# Patient Record
Sex: Female | Born: 1970 | Race: White | Hispanic: No | Marital: Married | Smoking: Never smoker
Health system: Southern US, Community
[De-identification: ages and names within clinical notes are randomized; demographics above are authoritative.]

## PROBLEM LIST (undated history)

## (undated) DIAGNOSIS — M7712 Lateral epicondylitis, left elbow: Secondary | ICD-10-CM

## (undated) DIAGNOSIS — J309 Allergic rhinitis, unspecified: Secondary | ICD-10-CM

## (undated) DIAGNOSIS — M19022 Primary osteoarthritis, left elbow: Secondary | ICD-10-CM

## (undated) DIAGNOSIS — N200 Calculus of kidney: Secondary | ICD-10-CM

## (undated) DIAGNOSIS — S83241A Other tear of medial meniscus, current injury, right knee, initial encounter: Secondary | ICD-10-CM

## (undated) DIAGNOSIS — K644 Residual hemorrhoidal skin tags: Secondary | ICD-10-CM

## (undated) DIAGNOSIS — J45909 Unspecified asthma, uncomplicated: Secondary | ICD-10-CM

## (undated) DIAGNOSIS — I10 Essential (primary) hypertension: Secondary | ICD-10-CM

## (undated) DIAGNOSIS — R600 Localized edema: Secondary | ICD-10-CM

## (undated) DIAGNOSIS — N3281 Overactive bladder: Secondary | ICD-10-CM

## (undated) DIAGNOSIS — N3010 Interstitial cystitis (chronic) without hematuria: Secondary | ICD-10-CM

## (undated) HISTORY — DX: Calculus of kidney: N20.0

## (undated) HISTORY — DX: Localized edema: R60.0

## (undated) HISTORY — PX: WISDOM TOOTH EXTRACTION: SHX21

## (undated) HISTORY — DX: Lateral epicondylitis, left elbow: M77.12

## (undated) HISTORY — DX: Allergic rhinitis, unspecified: J30.9

## (undated) HISTORY — DX: Interstitial cystitis (chronic) without hematuria: N30.10

## (undated) HISTORY — DX: Primary osteoarthritis, left elbow: M19.022

## (undated) HISTORY — DX: Essential (primary) hypertension: I10

## (undated) HISTORY — DX: Residual hemorrhoidal skin tags: K64.4

## (undated) HISTORY — DX: Overactive bladder: N32.81

---

## 1998-09-22 ENCOUNTER — Other Ambulatory Visit: Admission: RE | Admit: 1998-09-22 | Discharge: 1998-09-22 | Payer: Self-pay | Admitting: *Deleted

## 1999-05-13 ENCOUNTER — Ambulatory Visit (HOSPITAL_COMMUNITY): Admission: RE | Admit: 1999-05-13 | Discharge: 1999-05-13 | Payer: Self-pay | Admitting: Family Medicine

## 1999-05-13 ENCOUNTER — Encounter: Payer: Self-pay | Admitting: Family Medicine

## 1999-11-06 ENCOUNTER — Other Ambulatory Visit: Admission: RE | Admit: 1999-11-06 | Discharge: 1999-11-06 | Payer: Self-pay | Admitting: *Deleted

## 2000-05-26 ENCOUNTER — Encounter: Payer: Self-pay | Admitting: Orthopedic Surgery

## 2000-05-26 ENCOUNTER — Ambulatory Visit (HOSPITAL_COMMUNITY): Admission: RE | Admit: 2000-05-26 | Discharge: 2000-05-26 | Payer: Self-pay | Admitting: Orthopedic Surgery

## 2000-12-02 ENCOUNTER — Other Ambulatory Visit: Admission: RE | Admit: 2000-12-02 | Discharge: 2000-12-02 | Payer: Self-pay | Admitting: *Deleted

## 2002-03-05 ENCOUNTER — Other Ambulatory Visit: Admission: RE | Admit: 2002-03-05 | Discharge: 2002-03-05 | Payer: Self-pay | Admitting: Obstetrics and Gynecology

## 2002-03-07 ENCOUNTER — Encounter: Payer: Self-pay | Admitting: Obstetrics and Gynecology

## 2002-03-07 ENCOUNTER — Encounter: Admission: RE | Admit: 2002-03-07 | Discharge: 2002-03-07 | Payer: Self-pay | Admitting: Obstetrics and Gynecology

## 2002-05-02 ENCOUNTER — Encounter: Payer: Self-pay | Admitting: Obstetrics and Gynecology

## 2002-05-02 ENCOUNTER — Encounter: Admission: RE | Admit: 2002-05-02 | Discharge: 2002-05-02 | Payer: Self-pay | Admitting: Obstetrics and Gynecology

## 2006-06-28 ENCOUNTER — Encounter: Admission: RE | Admit: 2006-06-28 | Discharge: 2006-06-28 | Payer: Self-pay | Admitting: Internal Medicine

## 2007-07-05 ENCOUNTER — Encounter: Admission: RE | Admit: 2007-07-05 | Discharge: 2007-07-05 | Payer: Self-pay | Admitting: Internal Medicine

## 2008-07-05 ENCOUNTER — Encounter: Admission: RE | Admit: 2008-07-05 | Discharge: 2008-07-05 | Payer: Self-pay | Admitting: Internal Medicine

## 2008-07-09 ENCOUNTER — Encounter: Admission: RE | Admit: 2008-07-09 | Discharge: 2008-07-09 | Payer: Self-pay | Admitting: Internal Medicine

## 2010-10-19 ENCOUNTER — Other Ambulatory Visit: Payer: Self-pay | Admitting: Internal Medicine

## 2010-10-19 DIAGNOSIS — Z1231 Encounter for screening mammogram for malignant neoplasm of breast: Secondary | ICD-10-CM

## 2010-10-27 ENCOUNTER — Other Ambulatory Visit: Payer: Self-pay | Admitting: Internal Medicine

## 2010-10-27 ENCOUNTER — Ambulatory Visit
Admission: RE | Admit: 2010-10-27 | Discharge: 2010-10-27 | Disposition: A | Discharge status: Home / Self Care | Payer: BC Managed Care – PPO | Source: Ambulatory Visit | Attending: Internal Medicine | Admitting: Internal Medicine

## 2010-10-27 DIAGNOSIS — N63 Unspecified lump in unspecified breast: Secondary | ICD-10-CM

## 2010-10-27 DIAGNOSIS — Z1231 Encounter for screening mammogram for malignant neoplasm of breast: Secondary | ICD-10-CM

## 2010-11-02 ENCOUNTER — Ambulatory Visit
Admission: RE | Admit: 2010-11-02 | Discharge: 2010-11-02 | Disposition: A | Discharge status: Home / Self Care | Payer: BC Managed Care – PPO | Source: Ambulatory Visit | Attending: Internal Medicine | Admitting: Internal Medicine

## 2010-11-02 DIAGNOSIS — N63 Unspecified lump in unspecified breast: Secondary | ICD-10-CM

## 2011-11-19 ENCOUNTER — Other Ambulatory Visit: Payer: Self-pay | Admitting: Internal Medicine

## 2011-11-19 DIAGNOSIS — Z1231 Encounter for screening mammogram for malignant neoplasm of breast: Secondary | ICD-10-CM

## 2012-01-13 ENCOUNTER — Ambulatory Visit
Admission: RE | Admit: 2012-01-13 | Discharge: 2012-01-13 | Disposition: A | Discharge status: Home / Self Care | Payer: BC Managed Care – PPO | Source: Ambulatory Visit | Attending: Internal Medicine | Admitting: Internal Medicine

## 2012-01-13 DIAGNOSIS — Z1231 Encounter for screening mammogram for malignant neoplasm of breast: Secondary | ICD-10-CM

## 2012-12-28 ENCOUNTER — Other Ambulatory Visit: Payer: Self-pay

## 2012-12-28 DIAGNOSIS — Z1231 Encounter for screening mammogram for malignant neoplasm of breast: Secondary | ICD-10-CM

## 2013-01-04 ENCOUNTER — Encounter: Payer: Self-pay | Admitting: Internal Medicine

## 2013-01-25 ENCOUNTER — Encounter: Payer: Self-pay | Admitting: *Deleted

## 2013-01-29 ENCOUNTER — Ambulatory Visit
Admission: RE | Admit: 2013-01-29 | Discharge: 2013-01-29 | Disposition: A | Discharge status: Home / Self Care | Payer: BC Managed Care – PPO | Source: Ambulatory Visit

## 2013-01-29 DIAGNOSIS — Z1231 Encounter for screening mammogram for malignant neoplasm of breast: Secondary | ICD-10-CM

## 2013-03-23 ENCOUNTER — Ambulatory Visit: Payer: BC Managed Care – PPO | Admitting: Internal Medicine

## 2013-05-15 ENCOUNTER — Ambulatory Visit: Payer: BC Managed Care – PPO | Admitting: Internal Medicine

## 2013-05-23 ENCOUNTER — Inpatient Hospital Stay (HOSPITAL_COMMUNITY)
Admission: EM | Admit: 2013-05-23 | Discharge: 2013-05-25 | DRG: 202 | Disposition: A | Discharge status: Home / Self Care | Payer: BC Managed Care – PPO | Attending: Internal Medicine | Admitting: Internal Medicine

## 2013-05-23 ENCOUNTER — Encounter (HOSPITAL_COMMUNITY): Payer: Self-pay | Admitting: Emergency Medicine

## 2013-05-23 ENCOUNTER — Emergency Department (HOSPITAL_COMMUNITY): Payer: BC Managed Care – PPO

## 2013-05-23 DIAGNOSIS — E860 Dehydration: Secondary | ICD-10-CM | POA: Diagnosis present

## 2013-05-23 DIAGNOSIS — I498 Other specified cardiac arrhythmias: Secondary | ICD-10-CM | POA: Diagnosis present

## 2013-05-23 DIAGNOSIS — I10 Essential (primary) hypertension: Secondary | ICD-10-CM | POA: Diagnosis present

## 2013-05-23 DIAGNOSIS — Z800 Family history of malignant neoplasm of digestive organs: Secondary | ICD-10-CM

## 2013-05-23 DIAGNOSIS — J45901 Unspecified asthma with (acute) exacerbation: Secondary | ICD-10-CM | POA: Diagnosis present

## 2013-05-23 DIAGNOSIS — Z823 Family history of stroke: Secondary | ICD-10-CM

## 2013-05-23 DIAGNOSIS — E872 Acidosis, unspecified: Secondary | ICD-10-CM | POA: Diagnosis present

## 2013-05-23 DIAGNOSIS — R000 Tachycardia, unspecified: Secondary | ICD-10-CM

## 2013-05-23 DIAGNOSIS — Z833 Family history of diabetes mellitus: Secondary | ICD-10-CM

## 2013-05-23 DIAGNOSIS — J4521 Mild intermittent asthma with (acute) exacerbation: Secondary | ICD-10-CM

## 2013-05-23 DIAGNOSIS — J40 Bronchitis, not specified as acute or chronic: Secondary | ICD-10-CM

## 2013-05-23 DIAGNOSIS — N3010 Interstitial cystitis (chronic) without hematuria: Secondary | ICD-10-CM | POA: Diagnosis present

## 2013-05-23 DIAGNOSIS — R509 Fever, unspecified: Secondary | ICD-10-CM | POA: Diagnosis present

## 2013-05-23 DIAGNOSIS — J209 Acute bronchitis, unspecified: Principal | ICD-10-CM | POA: Diagnosis present

## 2013-05-23 DIAGNOSIS — Z8249 Family history of ischemic heart disease and other diseases of the circulatory system: Secondary | ICD-10-CM

## 2013-05-23 DIAGNOSIS — J111 Influenza due to unidentified influenza virus with other respiratory manifestations: Secondary | ICD-10-CM | POA: Diagnosis present

## 2013-05-23 DIAGNOSIS — R300 Dysuria: Secondary | ICD-10-CM | POA: Diagnosis present

## 2013-05-23 HISTORY — DX: Unspecified asthma, uncomplicated: J45.909

## 2013-05-23 LAB — CBC WITH DIFFERENTIAL/PLATELET
BASOS PCT: 0 % (ref 0–1)
Basophils Absolute: 0 10*3/uL (ref 0.0–0.1)
Eosinophils Absolute: 0 10*3/uL (ref 0.0–0.7)
Eosinophils Relative: 0 % (ref 0–5)
HCT: 39.7 % (ref 36.0–46.0)
HEMOGLOBIN: 13.5 g/dL (ref 12.0–15.0)
LYMPHS ABS: 0.6 10*3/uL — AB (ref 0.7–4.0)
LYMPHS PCT: 11 % — AB (ref 12–46)
MCH: 31.5 pg (ref 26.0–34.0)
MCHC: 34 g/dL (ref 30.0–36.0)
MCV: 92.5 fL (ref 78.0–100.0)
MONOS PCT: 9 % (ref 3–12)
Monocytes Absolute: 0.5 10*3/uL (ref 0.1–1.0)
NEUTROS ABS: 4.1 10*3/uL (ref 1.7–7.7)
NEUTROS PCT: 79 % — AB (ref 43–77)
Platelets: 142 10*3/uL — ABNORMAL LOW (ref 150–400)
RBC: 4.29 MIL/uL (ref 3.87–5.11)
RDW: 12.3 % (ref 11.5–15.5)
WBC: 5.2 10*3/uL (ref 4.0–10.5)

## 2013-05-23 LAB — INFLUENZA PANEL BY PCR (TYPE A & B)
H1N1 flu by pcr: NOT DETECTED
INFLAPCR: NEGATIVE
INFLBPCR: POSITIVE — AB

## 2013-05-23 LAB — COMPREHENSIVE METABOLIC PANEL
ALBUMIN: 3.8 g/dL (ref 3.5–5.2)
ALK PHOS: 50 U/L (ref 39–117)
ALT: 19 U/L (ref 0–35)
AST: 29 U/L (ref 0–37)
BUN: 11 mg/dL (ref 6–23)
CHLORIDE: 99 meq/L (ref 96–112)
CO2: 18 mEq/L — ABNORMAL LOW (ref 19–32)
Calcium: 9 mg/dL (ref 8.4–10.5)
Creatinine, Ser: 0.82 mg/dL (ref 0.50–1.10)
GFR calc Af Amer: >90 mL/min (ref >90)
GFR calc non Af Amer: 87 mL/min — ABNORMAL LOW (ref >90)
GLUCOSE: 118 mg/dL — AB (ref 70–99)
POTASSIUM: 3.5 meq/L — AB (ref 3.7–5.3)
Sodium: 137 mEq/L (ref 137–147)
Total Protein: 7 g/dL (ref 6.0–8.3)

## 2013-05-23 LAB — URINALYSIS, ROUTINE W REFLEX MICROSCOPIC
BILIRUBIN URINE: NEGATIVE
GLUCOSE, UA: NEGATIVE mg/dL
Hgb urine dipstick: NEGATIVE
KETONES UR: NEGATIVE mg/dL
Nitrite: NEGATIVE
Protein, ur: NEGATIVE mg/dL
Specific Gravity, Urine: 1.023 (ref 1.005–1.030)
Urobilinogen, UA: 0.2 mg/dL (ref 0.0–1.0)
pH: 6.5 (ref 5.0–8.0)

## 2013-05-23 LAB — URINE MICROSCOPIC-ADD ON

## 2013-05-23 LAB — LACTIC ACID, PLASMA: LACTIC ACID, VENOUS: 3.9 mmol/L — AB (ref 0.5–2.2)

## 2013-05-23 MED ORDER — METHYLPREDNISOLONE SODIUM SUCC 125 MG IJ SOLR
125.0000 mg | Freq: Once | INTRAMUSCULAR | Status: AC
  Administered 2013-05-23: 125 mg via INTRAVENOUS
  Filled 2013-05-23: qty 2

## 2013-05-23 MED ORDER — SODIUM CHLORIDE 0.9 % IJ SOLN: 3.0000 mL | Freq: Two times a day (BID) | INTRAMUSCULAR | Status: DC

## 2013-05-23 MED ORDER — ONDANSETRON HCL 4 MG PO TABS: 4.0000 mg | ORAL_TABLET | Freq: Four times a day (QID) | ORAL | Status: DC | PRN

## 2013-05-23 MED ORDER — LEVOFLOXACIN IN D5W 500 MG/100ML IV SOLN
500.0000 mg | INTRAVENOUS | Status: DC
  Administered 2013-05-23: 500 mg via INTRAVENOUS
  Filled 2013-05-23: qty 100

## 2013-05-23 MED ORDER — LEVALBUTEROL HCL 0.63 MG/3ML IN NEBU
0.6300 mg | INHALATION_SOLUTION | RESPIRATORY_TRACT | Status: DC
  Administered 2013-05-23 – 2013-05-24 (×4): 0.63 mg via RESPIRATORY_TRACT
  Filled 2013-05-23 (×10): qty 3

## 2013-05-23 MED ORDER — LEVALBUTEROL HCL 0.63 MG/3ML IN NEBU: 0.6300 mg | INHALATION_SOLUTION | RESPIRATORY_TRACT | Status: DC | PRN

## 2013-05-23 MED ORDER — ALBUTEROL (5 MG/ML) CONTINUOUS INHALATION SOLN
15.0000 mg/h | INHALATION_SOLUTION | Freq: Once | RESPIRATORY_TRACT | Status: AC
  Administered 2013-05-23: 15 mg/h via RESPIRATORY_TRACT
  Filled 2013-05-23: qty 20

## 2013-05-23 MED ORDER — SODIUM CHLORIDE 0.9 % IV SOLN
INTRAVENOUS | Status: DC
  Administered 2013-05-23: 14:00:00 via INTRAVENOUS

## 2013-05-23 MED ORDER — BENZONATATE 100 MG PO CAPS
100.0000 mg | ORAL_CAPSULE | Freq: Three times a day (TID) | ORAL | Status: DC
  Administered 2013-05-23 – 2013-05-25 (×6): 100 mg via ORAL
  Filled 2013-05-23 (×8): qty 1

## 2013-05-23 MED ORDER — GUAIFENESIN-DM 100-10 MG/5ML PO SYRP
5.0000 mL | ORAL_SOLUTION | ORAL | Status: DC | PRN
  Administered 2013-05-23 – 2013-05-24 (×7): 5 mL via ORAL
  Filled 2013-05-23 (×7): qty 10

## 2013-05-23 MED ORDER — IPRATROPIUM BROMIDE 0.02 % IN SOLN
0.5000 mg | Freq: Once | RESPIRATORY_TRACT | Status: AC
  Administered 2013-05-23: 0.5 mg via RESPIRATORY_TRACT
  Filled 2013-05-23: qty 2.5

## 2013-05-23 MED ORDER — METHYLPREDNISOLONE SODIUM SUCC 125 MG IJ SOLR
60.0000 mg | Freq: Four times a day (QID) | INTRAMUSCULAR | Status: DC
  Administered 2013-05-23 – 2013-05-24 (×3): 60 mg via INTRAVENOUS
  Filled 2013-05-23 (×7): qty 0.96

## 2013-05-23 MED ORDER — MORPHINE SULFATE 2 MG/ML IJ SOLN: 1.0000 mg | INTRAMUSCULAR | Status: DC | PRN

## 2013-05-23 MED ORDER — ENOXAPARIN SODIUM 40 MG/0.4ML ~~LOC~~ SOLN
40.0000 mg | SUBCUTANEOUS | Status: DC
  Administered 2013-05-23 – 2013-05-24 (×2): 40 mg via SUBCUTANEOUS
  Filled 2013-05-23 (×3): qty 0.4

## 2013-05-23 MED ORDER — SODIUM CHLORIDE 0.9 % IV SOLN
INTRAVENOUS | Status: DC
  Administered 2013-05-23 – 2013-05-24 (×3): via INTRAVENOUS

## 2013-05-23 MED ORDER — OXYCODONE HCL 5 MG PO TABS: 5.0000 mg | ORAL_TABLET | ORAL | Status: DC | PRN

## 2013-05-23 MED ORDER — ACETAMINOPHEN 650 MG RE SUPP: 650.0000 mg | Freq: Four times a day (QID) | RECTAL | Status: DC | PRN

## 2013-05-23 MED ORDER — ACETAMINOPHEN 325 MG PO TABS
650.0000 mg | ORAL_TABLET | Freq: Four times a day (QID) | ORAL | Status: DC | PRN
  Administered 2013-05-23 – 2013-05-24 (×3): 650 mg via ORAL
  Filled 2013-05-23 (×3): qty 2

## 2013-05-23 MED ORDER — ONDANSETRON HCL 4 MG/2ML IJ SOLN: 4.0000 mg | Freq: Four times a day (QID) | INTRAMUSCULAR | Status: DC | PRN

## 2013-05-23 NOTE — ED Notes (Signed)
Patient transported to X-ray 

## 2013-05-23 NOTE — H&P (Signed)
[facility] History and Physical  Laura Pope HQI:[id number] DOB: 10/11/1970 DOA: 05/23/2013   PCP: Nicholos Johns, MD  Specialists: She is followed by a pulmonologist, Dr. Carmelina Peal?  Chief Complaint: Cough and wheezing since Tuesday  HPI: Laura Pope is a 43 y.o. female with a past medical history of asthma, hypertension, who was in her usual state of health till this Monday, when she started noticing some itching in the lower part of her face. She tells me, that's usually a sign that she is going to get an asthma exacerbation. Subsequently on Tuesday, she started having a cough. Started having wheezing. She was working in a preschool. She tried taking her nebulizer treatments at home without any relief. She started having back pain and rib pain due to cough paroxysms. The cough has been dry. Denies any blood in the sputum. She had one episode when she had brown expectoration earlier today. Denies any nausea, vomiting. No fever or chills at home, but she was found to be with low-grade fever in the emergency department. She's had some tingling in her toes and fingers earlier today. She said that many children in her school had strep. She denies any sore throat, however. And her husband was diagnosed with pneumonia and influenza last week. She did get her flu shot this season. Denies any recent travel anywhere. No recent antibiotic use.  Home Medications: Prior to Admission medications   Medication Sig Start Date End Date Taking? Authorizing Provider  acetaminophen (TYLENOL) 500 MG tablet Take 1,000 mg by mouth every 4 (four) hours as needed for headache.   Yes Historical Provider, MD  albuterol (ACCUNEB) 1.25 MG/3ML nebulizer solution Take 2 ampules by nebulization every 6 (six) hours as needed for wheezing.   Yes Historical Provider, MD  albuterol (PROVENTIL HFA;VENTOLIN HFA) 108 (90 BASE) MCG/ACT inhaler Inhale 2 puffs into the lungs every 4 (four) hours as needed for wheezing or  shortness of breath.   Yes Historical Provider, MD  fluticasone (FLONASE) 50 MCG/ACT nasal spray Place 2 sprays into both nostrils daily.   Yes Historical Provider, MD  lisinopril-hydrochlorothiazide (PRINZIDE,ZESTORETIC) 10-12.5 MG per tablet Take 1 tablet by mouth daily.   Yes Historical Provider, MD  montelukast (SINGULAIR) 10 MG tablet Take 10 mg by mouth at bedtime.   Yes Historical Provider, MD  pentosan polysulfate (ELMIRON) 100 MG capsule Take 100 mg by mouth 3 (three) times daily.   Yes Historical Provider, MD  predniSONE (STERAPRED UNI-PAK) 10 MG tablet Take 5 tablets by mouth See admin instructions. Take 6 tablets on day 1, day 2 take 5 tablets, day 3 take 4 tablets, day 4 take 3 tablets, day 5 take 2 tablets. And day 6 take 1 tablet 05/23/13  Yes Historical Provider, MD  triamcinolone acetonide (KENALOG) 40 MG/ML injection Inject 40 mg into the muscle once.   Yes Historical Provider, MD  Vitamin D, Ergocalciferol, (DRISDOL) 50000 UNITS CAPS capsule Take 50,000 Units by mouth every 7 (seven) days. On mondays   Yes Historical Provider, MD    Allergies:  Allergies  Allergen Reactions  . Aspirin Swelling    Throat swelling  . Mushroom Extract Complex Swelling  . Contrast Media [Iodinated Diagnostic Agents] Itching and Rash  . Penicillins Swelling and Rash  . Vicodin [Hydrocodone-Acetaminophen] Swelling and Rash    Past Medical History: Past Medical History  Diagnosis Date  . External hemorrhoids   . Lateral epicondylitis of left elbow   . Arthritis of elbow, left   . Renal  calculi   . Pedal edema   . Overactive bladder   . Interstitial cystitis   . Allergic rhinitis   . Asthma     Past Surgical History  Procedure Laterality Date  . None    . Wisdom tooth extraction    . Left knee  surgery      Social History: She lives about 30 minutes from [place] with her family. 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 No smoking, alcohol or illicit drug use. Usually independent with  daily activities.  Family History:  Family History  Problem Relation Age of Onset  . Colon polyps Mother   . Colon cancer    . Hyperlipidemia    . Asthma    . Thyroid disease    . Diabetes    . Heart disease    . Stroke    . Diabetes Mother   . Hyperlipidemia Mother   . Hypertension Mother   . Heart disease Mother   . Thyroid disease Mother   . Arthritis Mother   . Asthma Sister   . Hyperlipidemia Father   . Heart attack Father      Review of Systems - History obtained from the patient General ROS: positive for  - fatigue Psychological ROS: negative Ophthalmic ROS: negative ENT ROS: negative Allergy and Immunology ROS: negative Hematological and Lymphatic ROS: negative Endocrine ROS: negative Respiratory ROS: as in hpi Cardiovascular ROS: ribcage pain Gastrointestinal ROS: no abdominal pain, change in bowel habits, or black or bloody stools Genito-Urinary ROS: some pain with urination Musculoskeletal ROS: negative Neurological ROS: no TIA or stroke symptoms Dermatological ROS: negative  Physical Examination  Filed Vitals:   05/23/13 1244 05/23/13 1257 05/23/13 1413  BP: 121/75    Pulse: 122 108   Temp: 99.6 F (37.6 C)  100.4 F (38 C)  TempSrc: Oral  Oral  Resp: 24    SpO2: 100%      BP 121/75  Pulse 108  Temp(Src) 100.4 F (38 C) (Oral)  Resp 24  SpO2 100%  LMP 03/28/2013  General appearance: alert, cooperative, appears stated age and no distress Head: Normocephalic, without obvious abnormality, atraumatic Eyes: conjunctivae/corneas clear. PERRL, EOM's intact. Throat: lips, mucosa, and tongue normal; teeth and gums normal Neck: no adenopathy, no carotid bruit, no JVD, supple, symmetrical, trachea midline and thyroid not enlarged, symmetric, no tenderness/mass/nodules Back: symmetric, no curvature. ROM normal. No CVA tenderness. Resp: poor air entry with minmal wheezing bilaterally Cardio: S1S2 tachy regular, no s3s4. no rubs murmurs or bruits. no  pedal edema. GI: soft, non-tender; bowel sounds normal; no masses,  no organomegaly Extremities: extremities normal, atraumatic, no cyanosis or edema Pulses: 2+ and symmetric Skin: Skin color, texture, turgor normal. No rashes or lesions Lymph nodes: Cervical, supraclavicular, and axillary nodes normal. Neurologic: Alert and oriented X 3, normal strength and tone. Normal symmetric reflexes. Normal coordination and gait  Laboratory Data: Results for orders placed during the hospital encounter of 05/23/13 (from the past 48 hour(s))  CBC WITH DIFFERENTIAL     Status: Abnormal   Collection Time    05/23/13  2:05 PM      Result Value Ref Range   WBC 5.2  4.0 - 10.5 K/uL   RBC 4.29  3.87 - 5.11 MIL/uL   Hemoglobin 13.5  12.0 - 15.0 g/dL   HCT 39.7  36.0 - 46.0 %   MCV 92.5  78.0 - 100.0 fL   MCH 31.5  26.0 - 34.0 pg   MCHC 34.0  30.0 - 36.0 g/dL   RDW 12.3  11.5 - 15.5 %   Platelets 142 (*) 150 - 400 K/uL   Neutrophils Relative % 79 (*) 43 - 77 %   Neutro Abs 4.1  1.7 - 7.7 K/uL   Lymphocytes Relative 11 (*) 12 - 46 %   Lymphs Abs 0.6 (*) 0.7 - 4.0 K/uL   Monocytes Relative 9  3 - 12 %   Monocytes Absolute 0.5  0.1 - 1.0 K/uL   Eosinophils Relative 0  0 - 5 %   Eosinophils Absolute 0.0  0.0 - 0.7 K/uL   Basophils Relative 0  0 - 1 %   Basophils Absolute 0.0  0.0 - 0.1 K/uL  COMPREHENSIVE METABOLIC PANEL     Status: Abnormal   Collection Time    05/23/13  2:05 PM      Result Value Ref Range   Sodium 137  137 - 147 mEq/L   Potassium 3.5 (*) 3.7 - 5.3 mEq/L   Chloride 99  96 - 112 mEq/L   CO2 18 (*) 19 - 32 mEq/L   Glucose, Bld 118 (*) 70 - 99 mg/dL   BUN 11  6 - 23 mg/dL   Creatinine, Ser 0.82  0.50 - 1.10 mg/dL   Calcium 9.0  8.4 - 10.5 mg/dL   Total Protein 7.0  6.0 - 8.3 g/dL   Albumin 3.8  3.5 - 5.2 g/dL   AST 29  0 - 37 U/L   ALT 19  0 - 35 U/L   Alkaline Phosphatase 50  39 - 117 U/L   Total Bilirubin <0.2 (*) 0.3 - 1.2 mg/dL   GFR calc non Af Amer 87 (*) >90 mL/min    GFR calc Af Amer >90  >90 mL/min   Comment: (NOTE)     The eGFR has been calculated using the CKD EPI equation.     This calculation has not been validated in all clinical situations.     eGFR's persistently <90 mL/min signify possible Chronic Kidney     Disease.    Radiology Reports: Dg Chest 2 View  05/23/2013   CLINICAL DATA Shortness of breath, cough, history asthma, hypertension  EXAM CHEST  2 VIEW  COMPARISON None  FINDINGS Normal heart size, mediastinal contours, and pulmonary vascularity.  Lungs slightly hyperinflated but clear.  No pleural effusion or pneumothorax.  IMPRESSION No acute abnormalities.  SIGNATURE  Electronically Signed   By: Lavonia Dana M.D.   On: 05/23/2013 14:25    Electrocardiogram: Sinus tachycardia to 9 beats per minute. Normal axis. Intervals normal. No concerning ST or T-wave changes are noted.  Problem List  Principal Problem:   Acute bronchitis Active Problems:   Metabolic acidosis   Fever, unspecified   Assessment: This is a 43 year old, Caucasian female, presents with wheezing, cough, ongoing for the last 2 days. This is most likely acute bronchitis, or an exacerbation of her asthma. Influenza is a possibility considering her recent exposure. Pain and back pain, most likely due to cough paroxysms. She's also noted to have low bicarbonate on her blood work. She mentions dysuria as well.  Plan: #1 acute bronchitis: She will be clear with nebulizer treatments, steroids, and antibiotics. Influenza, PCR will be ordered. Oxygen as needed.  #2 sinus tachycardia: Most likely due to nebulizer treatments, and respiratory symptoms. EKG does not show any ischemic changes. Continue to monitor on telemetry.  #3 metabolic acidosis: Could be from dehydration. We will repeat the values in  the morning. We'll check a lactic acid level in the morning. Give fluids.  #4 history of hypertension: Blood pressures are normal at this time. Continue to monitor. Hold her oral  agents for now.  #5 Dysuria: Check UA.   DVT Prophylaxis: Lovenox Code Status: Full code Family Communication: Discussed with the patient, her husband and her mother  Disposition Plan: Admit to telemetry. She will likely return home when better.   Further management decisions will depend on results of further testing and patient's response to treatment.  [facility]  [facility] Pager [phone]  If 7PM-7AM, please contact night-coverage [email or web address] Password [facility]  05/23/2013, 4:01 PM

## 2013-05-23 NOTE — ED Provider Notes (Signed)
CSN: [id number]     Arrival date & time 05/23/13  1242 History   First MD Initiated Contact with Patient 05/23/13 1313     Chief Complaint  Patient presents with  . Shortness of Breath     (Consider location/radiation/quality/duration/timing/severity/associated sxs/prior Treatment) HPI\ patient reports a long history of reactive airway disease however last time she was admitted was 20 years ago when she was pregnant with her child. She states 2 days ago she started getting shortness of breath. She started using her nebulizer at home. She reports yesterday she was outside and it seemed to get progressively worse and during the night it got a lot worse. She was seen by her PCP this morning and had 2 nebulizer treatments in the office. She was prescribed steroids and a prescription for Singulair. However after she got home she started getting more short of breath again. She called the office and they had her do another nebulizer at home however she did not feel improved. She reports she's had a cough off and on since January that is dry. She states she has had chills with temperature up to 100 the last couple days. She denies rhinorrhea, sore throat, nausea, vomiting, or diarrhea. She does state she did have some hoarseness of her voice today after using the nebulizers. She describes dyspnea on exertion. She also states her fingers in her chin are tingling which happens before she has her asthma attack.  [place]  Past Medical History  Diagnosis Date  . External hemorrhoids   . Lateral epicondylitis of left elbow   . Arthritis of elbow, left   . Renal calculi   . Pedal edema   . Overactive bladder   . Interstitial cystitis   . Allergic rhinitis   . Asthma    Past Surgical History  Procedure Laterality Date  . None    . Wisdom tooth extraction    . Left knee  surgery     Family History  Problem Relation Age of Onset  . Colon polyps Mother   . Colon cancer    .  Hyperlipidemia    . Asthma    . Thyroid disease    . Diabetes    . Heart disease    . Stroke    . Diabetes Mother   . Hyperlipidemia Mother   . Hypertension Mother   . Heart disease Mother   . Thyroid disease Mother   . Arthritis Mother   . Asthma Sister   . Hyperlipidemia Father   . Heart attack Father    History  Substance Use Topics  . Smoking status: Never Smoker   . Smokeless tobacco: Never Used  . Alcohol Use: No   Lives at home Lives with spouse Employed    OB History   Grav Para Term Preterm Abortions TAB SAB Ect Mult Living                 Review of Systems  All other systems reviewed and are negative.      Allergies  Aspirin; Mushroom extract complex; Contrast media; Oxycodone; Penicillins; and Vicodin  Home Medications   Current Outpatient Rx  Name  Route  Sig  Dispense  Refill  . acetaminophen (TYLENOL) 500 MG tablet   Oral   Take 1,000 mg by mouth every 4 (four) hours as needed for headache.         . albuterol (ACCUNEB) 1.25 MG/3ML nebulizer solution   Nebulization  Take 2 ampules by nebulization every 6 (six) hours as needed for wheezing.         Marland Kitchen albuterol (PROVENTIL HFA;VENTOLIN HFA) 108 (90 BASE) MCG/ACT inhaler   Inhalation   Inhale 2 puffs into the lungs every 4 (four) hours as needed for wheezing or shortness of breath.         . fluticasone (FLONASE) 50 MCG/ACT nasal spray   Each Nare   Place 2 sprays into both nostrils daily.         Marland Kitchen lisinopril-hydrochlorothiazide (PRINZIDE,ZESTORETIC) 10-12.5 MG per tablet   Oral   Take 1 tablet by mouth daily.         . montelukast (SINGULAIR) 10 MG tablet   Oral   Take 10 mg by mouth at bedtime.         . pentosan polysulfate (ELMIRON) 100 MG capsule   Oral   Take 100 mg by mouth 3 (three) times daily.         . predniSONE (STERAPRED UNI-PAK) 10 MG tablet   Oral   Take 5 tablets by mouth See admin instructions. Take 6 tablets on day 1, day 2 take 5 tablets, day 3  take 4 tablets, day 4 take 3 tablets, day 5 take 2 tablets. And day 6 take 1 tablet         . triamcinolone acetonide (KENALOG) 40 MG/ML injection   Intramuscular   Inject 40 mg into the muscle once.         . Vitamin D, Ergocalciferol, (DRISDOL) 50000 UNITS CAPS capsule   Oral   Take 50,000 Units by mouth every 7 (seven) days. On mondays          BP 121/75  Pulse 108  Temp(Src) 100.4 F (38 C) (Oral)  Resp 24  SpO2 100%  LMP 03/28/2013  Vital signs normal except tachycardia and low grade fever  Physical Exam  Nursing note and vitals reviewed. Constitutional: She is oriented to person, place, and time. She appears well-developed and well-nourished.  Non-toxic appearance. She does not appear ill. No distress.  HENT:  Head: Normocephalic and atraumatic.  Right Ear: External ear normal.  Left Ear: External ear normal.  Nose: Nose normal. No mucosal edema or rhinorrhea.  Mouth/Throat: Oropharynx is clear and moist and mucous membranes are normal. No dental abscesses or uvula swelling.  Eyes: Conjunctivae and EOM are normal. Pupils are equal, round, and reactive to light.  Neck: Normal range of motion and full passive range of motion without pain. Neck supple.  Cardiovascular: Normal rate, regular rhythm and normal heart sounds.  Exam reveals no gallop and no friction rub.   No murmur heard. Pulmonary/Chest: Accessory muscle usage present. No respiratory distress. She has decreased breath sounds. She has no wheezes. She has no rhonchi. She has no rales. She exhibits no tenderness and no crepitus.  Abdominal: Soft. Normal appearance and bowel sounds are normal. She exhibits no distension. There is no tenderness. There is no rebound and no guarding.  Musculoskeletal: Normal range of motion. She exhibits no edema and no tenderness.  Moves all extremities well.   Neurological: She is alert and oriented to person, place, and time. She has normal strength. No cranial nerve deficit.   Skin: Skin is warm, dry and intact. No rash noted. No erythema. No pallor.  PT feels hot to touch  Psychiatric: She has a normal mood and affect. Her speech is normal and behavior is normal. Her mood appears not anxious.  ED Course  Procedures (including critical care time)  Medications  methylPREDNISolone sodium succinate (SOLU-MEDROL) 125 mg/2 mL injection 60 mg (not administered)  albuterol (PROVENTIL,VENTOLIN) solution continuous neb (15 mg/hr Nebulization Given 05/23/13 1416)  methylPREDNISolone sodium succinate (SOLU-MEDROL) 125 mg/2 mL injection 125 mg (125 mg Intravenous Given 05/23/13 1416)  ipratropium (ATROVENT) nebulizer solution 0.5 mg (0.5 mg Nebulization Given 05/23/13 1416)    Discussed with patient she had failed outpatient treatment of her asthma. She is agreeable for admission.  15:06 Dr Barnie Del, admit to med-surg, team 8, he will decide on antibiotics.  Labs Review Results for orders placed during the hospital encounter of 05/23/13  CBC WITH DIFFERENTIAL      Result Value Ref Range   WBC 5.2  4.0 - 10.5 K/uL   RBC 4.29  3.87 - 5.11 MIL/uL   Hemoglobin 13.5  12.0 - 15.0 g/dL   HCT 09.3  23.5 - 57.3 %   MCV 92.5  78.0 - 100.0 fL   MCH 31.5  26.0 - 34.0 pg   MCHC 34.0  30.0 - 36.0 g/dL   RDW 22.0  25.4 - 27.0 %   Platelets 142 (*) 150 - 400 K/uL   Neutrophils Relative % 79 (*) 43 - 77 %   Neutro Abs 4.1  1.7 - 7.7 K/uL   Lymphocytes Relative 11 (*) 12 - 46 %   Lymphs Abs 0.6 (*) 0.7 - 4.0 K/uL   Monocytes Relative 9  3 - 12 %   Monocytes Absolute 0.5  0.1 - 1.0 K/uL   Eosinophils Relative 0  0 - 5 %   Eosinophils Absolute 0.0  0.0 - 0.7 K/uL   Basophils Relative 0  0 - 1 %   Basophils Absolute 0.0  0.0 - 0.1 K/uL  COMPREHENSIVE METABOLIC PANEL      Result Value Ref Range   Sodium 137  137 - 147 mEq/L   Potassium 3.5 (*) 3.7 - 5.3 mEq/L   Chloride 99  96 - 112 mEq/L   CO2 18 (*) 19 - 32 mEq/L   Glucose, Bld 118 (*) 70 - 99 mg/dL   BUN 11  6 - 23  mg/dL   Creatinine, Ser 6.23  0.50 - 1.10 mg/dL   Calcium 9.0  8.4 - 76.2 mg/dL   Total Protein 7.0  6.0 - 8.3 g/dL   Albumin 3.8  3.5 - 5.2 g/dL   AST 29  0 - 37 U/L   ALT 19  0 - 35 U/L   Alkaline Phosphatase 50  39 - 117 U/L   Total Bilirubin <0.2 (*) 0.3 - 1.2 mg/dL   GFR calc non Af Amer 87 (*) >90 mL/min   GFR calc Af Amer >90  >90 mL/min   Laboratory interpretation all normal except mild hypokalemia, anion gap of 20    Imaging Review Dg Chest 2 View  05/23/2013   CLINICAL DATA Shortness of breath, cough, history asthma, hypertension  EXAM CHEST  2 VIEW  COMPARISON None  FINDINGS Normal heart size, mediastinal contours, and pulmonary vascularity.  Lungs slightly hyperinflated but clear.  No pleural effusion or pneumothorax.  IMPRESSION No acute abnormalities.  SIGNATURE  Electronically Signed   By: Ulyses Southward M.D.   On: 05/23/2013 14:25     EKG Interpretation None      MDM   Final diagnoses:  Exacerbation of intermittent asthma  Bronchitis  Metabolic acidosis     Plan admission  Devoria Albe, MD, Armando Gang  CRITICAL CARE Performed by: Devoria Albe L Total critical care time: 31 min Critical care time was exclusive of separately billable procedures and treating other patients. Critical care was necessary to treat or prevent imminent or life-threatening deterioration. Critical care was time spent personally by me on the following activities: development of treatment plan with patient and/or surrogate as well as nursing, discussions with consultants, evaluation of patient's response to treatment, examination of patient, obtaining history from patient or surrogate, ordering and performing treatments and interventions, ordering and review of laboratory studies, ordering and review of radiographic studies, pulse oximetry and re-evaluation of patient's condition.    Ward Givens, MD 05/23/13 [phone]

## 2013-05-23 NOTE — ED Notes (Addendum)
Pt c/o SOB and posterior ribcage pain x 1 day.  Pain score 8/10.  Pt reports that she went to her PCP this morning and received a breathing treatment and prednisone.

## 2013-05-23 NOTE — ED Notes (Signed)
MD at bedside. 

## 2013-05-24 DIAGNOSIS — J111 Influenza due to unidentified influenza virus with other respiratory manifestations: Secondary | ICD-10-CM | POA: Diagnosis present

## 2013-05-24 LAB — COMPREHENSIVE METABOLIC PANEL
ALT: 16 U/L (ref 0–35)
AST: 26 U/L (ref 0–37)
Albumin: 3.2 g/dL — ABNORMAL LOW (ref 3.5–5.2)
Alkaline Phosphatase: 42 U/L (ref 39–117)
BUN: 11 mg/dL (ref 6–23)
CO2: 19 meq/L (ref 19–32)
CREATININE: 0.69 mg/dL (ref 0.50–1.10)
Calcium: 8 mg/dL — ABNORMAL LOW (ref 8.4–10.5)
Chloride: 105 mEq/L (ref 96–112)
GFR calc Af Amer: >90 mL/min (ref >90)
GLUCOSE: 144 mg/dL — AB (ref 70–99)
Potassium: 4.1 mEq/L (ref 3.7–5.3)
Sodium: 139 mEq/L (ref 137–147)
Total Protein: 6 g/dL (ref 6.0–8.3)

## 2013-05-24 LAB — CBC
HCT: 35.2 % — ABNORMAL LOW (ref 36.0–46.0)
Hemoglobin: 11.8 g/dL — ABNORMAL LOW (ref 12.0–15.0)
MCH: 31.4 pg (ref 26.0–34.0)
MCHC: 33.5 g/dL (ref 30.0–36.0)
MCV: 93.6 fL (ref 78.0–100.0)
PLATELETS: 121 10*3/uL — AB (ref 150–400)
RBC: 3.76 MIL/uL — AB (ref 3.87–5.11)
RDW: 12.4 % (ref 11.5–15.5)
WBC: 4.1 10*3/uL (ref 4.0–10.5)

## 2013-05-24 MED ORDER — OSELTAMIVIR PHOSPHATE 75 MG PO CAPS
75.0000 mg | ORAL_CAPSULE | Freq: Two times a day (BID) | ORAL | Status: DC
  Administered 2013-05-24 – 2013-05-25 (×3): 75 mg via ORAL
  Filled 2013-05-24 (×4): qty 1

## 2013-05-24 MED ORDER — LEVALBUTEROL HCL 0.63 MG/3ML IN NEBU
0.6300 mg | INHALATION_SOLUTION | Freq: Four times a day (QID) | RESPIRATORY_TRACT | Status: DC
Start: 2013-05-24 — End: 2013-05-25
  Administered 2013-05-24 – 2013-05-25 (×3): 0.63 mg via RESPIRATORY_TRACT
  Filled 2013-05-24 (×9): qty 3

## 2013-05-24 MED ORDER — METHYLPREDNISOLONE SODIUM SUCC 125 MG IJ SOLR
60.0000 mg | Freq: Two times a day (BID) | INTRAMUSCULAR | Status: DC
  Administered 2013-05-24 – 2013-05-25 (×2): 60 mg via INTRAVENOUS
  Filled 2013-05-24 (×4): qty 0.96

## 2013-05-24 MED ORDER — LEVOFLOXACIN 500 MG PO TABS
500.0000 mg | ORAL_TABLET | Freq: Every day | ORAL | Status: DC
  Administered 2013-05-24 – 2013-05-25 (×2): 500 mg via ORAL
  Filled 2013-05-24 (×3): qty 1

## 2013-05-24 NOTE — Progress Notes (Signed)
Patient's flu swab came back positive. NP notified, new orders given for Tamiflu.

## 2013-05-24 NOTE — Progress Notes (Signed)
[facility] PROGRESS NOTE  Laura GrillsMelisa S Pope OZH:[id number] DOB: 04/02/1970 DOA: 05/23/2013  PCP: Laura Pope,NINA, MD  Brief HPI: Laura Pope is a 43 y.o. female with a past medical history of asthma, hypertension, who presented with worsening cough and wheezing. She was thought to have acute bronchitis brought on by Influenza.   Past medical history:  Past Medical History  Diagnosis Date  . External hemorrhoids   . Lateral epicondylitis of left elbow   . Arthritis of elbow, left   . Renal calculi   . Pedal edema   . Overactive bladder   . Interstitial cystitis   . Allergic rhinitis   . Asthma     Consultants: None  Procedures: None  Antibiotics: Levaquin 3/11--> Tamiflu 3/11-->  Subjective: Patient feels better today. Still coughing but is dry. Wheezing is better.  Objective: Vital Signs  Filed Vitals:   05/24/13 0024 05/24/13 0422 05/24/13 0438 05/24/13 0736  BP:  119/59    Pulse:  72    Temp:  98.4 F (36.9 C)    TempSrc:  Oral    Resp:  18    Height:      Weight:      SpO2: 99% 100% 100% 99%    Intake/Output Summary (Last 24 hours) at 05/24/13 0819 Last data filed at 05/24/13 0600  Gross per 24 hour  Intake   1870 ml  Output    300 ml  Net   1570 ml   Filed Weights   05/23/13 1603  Weight: 88.8 kg (195 lb 12.3 oz)   General appearance: alert, cooperative, appears stated age and no distress Resp: coarse breath sounds bilaterally without wheezing or crackles. Cardio: regular rate and rhythm, S1, S2 normal, no murmur, click, rub or gallop GI: soft, non-tender; bowel sounds normal; no masses,  no organomegaly Extremities: extremities normal, atraumatic, no cyanosis or edema Pulses: 2+ and symmetric Skin: Skin color, texture, turgor normal. No rashes or lesions Neurologic: No focal deficits.  Lab Results:  Basic Metabolic Panel:  Recent Labs Lab 05/23/13 1405 05/24/13 0330  NA 137 139  K 3.5* 4.1  CL 99 105  CO2 18* 19  GLUCOSE  118* 144*  BUN 11 11  CREATININE 0.82 0.69  CALCIUM 9.0 8.0*   Liver Function Tests:  Recent Labs Lab 05/23/13 1405 05/24/13 0330  AST 29 26  ALT 19 16  ALKPHOS 50 42  BILITOT <0.2* <0.2*  PROT 7.0 6.0  ALBUMIN 3.8 3.2*   CBC:  Recent Labs Lab 05/23/13 1405 05/24/13 0330  WBC 5.2 4.1  NEUTROABS 4.1  --   HGB 13.5 11.8*  HCT 39.7 35.2*  MCV 92.5 93.6  PLT 142* 121*    Studies/Results: Dg Chest 2 View  05/23/2013   CLINICAL DATA Shortness of breath, cough, history asthma, hypertension  EXAM CHEST  2 VIEW  COMPARISON None  FINDINGS Normal heart size, mediastinal contours, and pulmonary vascularity.  Lungs slightly hyperinflated but clear.  No pleural effusion or pneumothorax.  IMPRESSION No acute abnormalities.  SIGNATURE  Electronically Signed   By: Ulyses SouthwardMark  Boles M.D.   On: 05/23/2013 14:25    Medications:  Scheduled: . benzonatate  100 mg Oral TID  . enoxaparin (LOVENOX) injection  40 mg Subcutaneous Q24H  . levalbuterol  0.63 mg Nebulization Q4H  . levofloxacin (LEVAQUIN) IV  500 mg Intravenous Q24H  . methylPREDNISolone (SOLU-MEDROL) injection  60 mg Intravenous Q6H  . oseltamivir  75 mg Oral BID  . sodium chloride  3 mL Intravenous Q12H   Continuous: . sodium chloride 75 mL/hr at 05/24/13 1610   RUE:[id number], acetaminophen, guaiFENesin-dextromethorphan, levalbuterol, ondansetron (ZOFRAN) IV, ondansetron  Assessment/Plan:  Principal Problem:   Acute bronchitis Active Problems:   Metabolic acidosis   Fever, unspecified   Sinus tachycardia   Dysuria    Influenza and Acute bronchitis Patient is improving. Continue current treatment with nebulizer treatments, steroids, and antibiotics. Tamiflu was added last night. Change to oral Levaquin. Decrease steroid frequency.  Sinus tachycardia Resolved. Most likely due to nebulizer treatments, and respiratory symptoms. EKG did not show any ischemic changes. Continue to monitor on telemetry.   Metabolic  acidosis Improved. Was from dehydration. Lactic acid level was elevated last night. Clinically she has improved. No need to repeat.   History of hypertension Blood pressures are stable.   Dysuria UA noted to be abnormal but not definite for infection. But since she was symptomatic will check culture and continue Levaquin.  DVT Prophylaxis: Lovenox  Code Status: Full code  Family Communication: Discussed with the patient, her husband  Disposition Plan: She will likely return home when better. Anticipate discharge 3/13. Mobilize.    LOS: 1 day   [facility]  [facility] Pager [phone] 05/24/2013, 8:19 AM  If 8PM-8AM, please contact night-coverage at [email or web address], password [email or web address]

## 2013-05-25 LAB — URINE CULTURE

## 2013-05-25 LAB — CBC
HCT: 36.1 % (ref 36.0–46.0)
Hemoglobin: 11.8 g/dL — ABNORMAL LOW (ref 12.0–15.0)
MCH: 31.4 pg (ref 26.0–34.0)
MCHC: 32.7 g/dL (ref 30.0–36.0)
MCV: 96 fL (ref 78.0–100.0)
Platelets: 136 10*3/uL — ABNORMAL LOW (ref 150–400)
RBC: 3.76 MIL/uL — ABNORMAL LOW (ref 3.87–5.11)
RDW: 12.7 % (ref 11.5–15.5)
WBC: 11.8 10*3/uL — ABNORMAL HIGH (ref 4.0–10.5)

## 2013-05-25 LAB — BASIC METABOLIC PANEL
BUN: 16 mg/dL (ref 6–23)
CO2: 23 mEq/L (ref 19–32)
Calcium: 7.9 mg/dL — ABNORMAL LOW (ref 8.4–10.5)
Chloride: 106 mEq/L (ref 96–112)
Creatinine, Ser: 0.69 mg/dL (ref 0.50–1.10)
GFR calc Af Amer: >90 mL/min (ref >90)
Glucose, Bld: 148 mg/dL — ABNORMAL HIGH (ref 70–99)
Potassium: 4.7 mEq/L (ref 3.7–5.3)
Sodium: 140 mEq/L (ref 137–147)

## 2013-05-25 MED ORDER — LEVOFLOXACIN 500 MG PO TABS: 500.0000 mg | ORAL_TABLET | Freq: Every day | ORAL | Status: DC

## 2013-05-25 MED ORDER — LISINOPRIL-HYDROCHLOROTHIAZIDE 10-12.5 MG PO TABS: 1.0000 | ORAL_TABLET | Freq: Every day | ORAL | Status: AC

## 2013-05-25 MED ORDER — OSELTAMIVIR PHOSPHATE 75 MG PO CAPS: 75.0000 mg | ORAL_CAPSULE | Freq: Two times a day (BID) | ORAL | Status: DC

## 2013-05-25 MED ORDER — BENZONATATE 100 MG PO CAPS: 100.0000 mg | ORAL_CAPSULE | Freq: Three times a day (TID) | ORAL | Status: DC

## 2013-05-25 MED ORDER — GUAIFENESIN-DM 100-10 MG/5ML PO SYRP: 5.0000 mL | ORAL_SOLUTION | ORAL | Status: AC | PRN

## 2013-05-25 MED ORDER — PREDNISONE 50 MG PO TABS
60.0000 mg | ORAL_TABLET | Freq: Every day | ORAL | Status: DC
  Filled 2013-05-25 (×2): qty 1

## 2013-05-25 MED ORDER — PREDNISONE 10 MG PO TABS: ORAL_TABLET | ORAL | Status: DC

## 2013-05-25 NOTE — Discharge Summary (Signed)
[facility]  Physician Discharge Summary   Patient ID: Laura Pope MRN: [id number] DOB/AGE: 12/07/1970 43 y.o.  Admit date: 05/23/2013 Discharge date: 05/25/2013  PCP: Lucianne Lei, MD  DISCHARGE DIAGNOSES:  Principal Problem:   Acute bronchitis Active Problems:   Metabolic acidosis   Fever, unspecified   Sinus tachycardia   Dysuria   Influenza with respiratory manifestations   RECOMMENDATIONS FOR OUTPATIENT FOLLOW UP: 1. Needs follow up next week. 2. Urine culture is pending.  DISCHARGE CONDITION: fair  Diet recommendation: Regular  Filed Weights   05/23/13 1603  Weight: 88.8 kg (195 lb 12.3 oz)    INITIAL HISTORY: Laura Pope is a 43 y.o. female with a past medical history of asthma, hypertension, who presented with worsening cough and wheezing. She was thought to have acute bronchitis brought on by Influenza.   Consultations:  None  Procedures:  None  HOSPITAL COURSE:   Influenza and Acute bronchitis  Patient was admited and started on nebulizer treatments, steroids, and antibiotics. Influenza PCR was positive. She was also started on tamiflu. She continued to improve daily. Today she feels much better and requesting to go home. Cough is stable. Rib cage pain is much better,. She will be discharged with tapering steroids. She already has nebulizers at home.   Sinus tachycardia  This has resolved. This was most likely due to nebulizer treatments, and respiratory symptoms. EKG did not show any ischemic changes.   Metabolic acidosis  Her bicarbonate on bmet was slightly low. Lactic acid was elevated. This was all most likely from acute illness and dehydration. Bicarbonate levels have improved.   History of hypertension  Blood pressures are stable. Will be asked to stay off of her ACEI/Diuretic till follow up with PCP.  Dysuria  Patient complained of dysuria. UA noted to be abnormal but not definite for infection. But since she was  symptomatic she was maintained on Levaquin. Urine cultures are pending.  Patient is feeling better. She is stable for discharge.    PERTINENT LABS:  The results of significant diagnostics from this hospitalization (including imaging, microbiology, ancillary and laboratory) are listed below for reference.     Labs: Basic Metabolic Panel:  Recent Labs Lab 05/23/13 1405 05/24/13 0330 05/25/13 0338  NA 137 139 140  K 3.5* 4.1 4.7  CL 99 105 106  CO2 18* 19 23  GLUCOSE 118* 144* 148*  BUN 11 11 16   CREATININE 0.82 0.69 0.69  CALCIUM 9.0 8.0* 7.9*   Liver Function Tests:  Recent Labs Lab 05/23/13 1405 05/24/13 0330  AST 29 26  ALT 19 16  ALKPHOS 50 42  BILITOT <0.2* <0.2*  PROT 7.0 6.0  ALBUMIN 3.8 3.2*   CBC:  Recent Labs Lab 05/23/13 1405 05/24/13 0330 05/25/13 0338  WBC 5.2 4.1 11.8*  NEUTROABS 4.1  --   --   HGB 13.5 11.8* 11.8*  HCT 39.7 35.2* 36.1  MCV 92.5 93.6 96.0  PLT 142* 121* 136*    IMAGING STUDIES Dg Chest 2 View  05/23/2013   CLINICAL DATA Shortness of breath, cough, history asthma, hypertension  EXAM CHEST  2 VIEW  COMPARISON None  FINDINGS Normal heart size, mediastinal contours, and pulmonary vascularity.  Lungs slightly hyperinflated but clear.  No pleural effusion or pneumothorax.  IMPRESSION No acute abnormalities.  SIGNATURE  Electronically Signed   By: Ulyses Southward M.D.   On: 05/23/2013 14:25    DISCHARGE EXAMINATION: Filed Vitals:   05/24/13 2012 05/24/13 2129 05/24/13 2344 05/25/13  0453  BP:  98/51 98/55 102/53  Pulse:  73  71  Temp:    98.7 F (37.1 C)  TempSrc:  Oral  Oral  Resp:  18  18  Height:      Weight:      SpO2: 96% 98%  100%   General appearance: alert, cooperative, appears stated age and no distress Resp: coarse breath sounds without wheezing or crackles. Cardio: regular rate and rhythm, S1, S2 normal, no murmur, click, rub or gallop GI: soft, non-tender; bowel sounds normal; no masses,  no  organomegaly Extremities: extremities normal, atraumatic, no cyanosis or edema  DISPOSITION: Home  Discharge Orders   Future Orders Complete By Expires   Diet general  As directed    Discharge instructions  As directed    Comments:     Please follow up with your PCP early next week. Do not go to work till mid next week.   Increase activity slowly  As directed       ALLERGIES:  Allergies  Allergen Reactions  . Aspirin Swelling    Throat swelling  . Mushroom Extract Complex Swelling  . Contrast Media [Iodinated Diagnostic Agents] Itching and Rash  . Oxycodone Rash    Developed an itchy rash 24 hours after taking Percocet. She had no problems with her airway.  . Penicillins Swelling and Rash  . Vicodin [Hydrocodone-Acetaminophen] Swelling and Rash    Current Discharge Medication List    START taking these medications   Details  benzonatate (TESSALON) 100 MG capsule Take 1 capsule (100 mg total) by mouth 3 (three) times daily. Qty: 21 capsule, Refills: 0    guaiFENesin-dextromethorphan (ROBITUSSIN DM) 100-10 MG/5ML syrup Take 5 mLs by mouth every 4 (four) hours as needed for cough. Qty: 118 mL, Refills: 0    levofloxacin (LEVAQUIN) 500 MG tablet Take 1 tablet (500 mg total) by mouth daily. Qty: 4 tablet, Refills: 0    oseltamivir (TAMIFLU) 75 MG capsule Take 1 capsule (75 mg total) by mouth 2 (two) times daily. Qty: 8 capsule, Refills: 0    predniSONE (DELTASONE) 10 MG tablet Take 6 tablets once daily for 3 days, then take 4 tablets once daily for 3 days, then take 2 tablets once daily for 3 days, then take 1 tablet once daily for 3 days, then STOP Qty: 39 tablet, Refills: 0      CONTINUE these medications which have CHANGED   Details  lisinopril-hydrochlorothiazide (PRINZIDE,ZESTORETIC) 10-12.5 MG per tablet Take 1 tablet by mouth daily. DO NOT RESUME TILL SEEN BY PCP.      CONTINUE these medications which have NOT CHANGED   Details  acetaminophen (TYLENOL) 500 MG  tablet Take 1,000 mg by mouth every 4 (four) hours as needed for headache.    albuterol (ACCUNEB) 1.25 MG/3ML nebulizer solution Take 2 ampules by nebulization every 6 (six) hours as needed for wheezing.    albuterol (PROVENTIL HFA;VENTOLIN HFA) 108 (90 BASE) MCG/ACT inhaler Inhale 2 puffs into the lungs every 4 (four) hours as needed for wheezing or shortness of breath.    fluticasone (FLONASE) 50 MCG/ACT nasal spray Place 2 sprays into both nostrils daily.    montelukast (SINGULAIR) 10 MG tablet Take 10 mg by mouth at bedtime.    pentosan polysulfate (ELMIRON) 100 MG capsule Take 100 mg by mouth 3 (three) times daily.    Vitamin D, Ergocalciferol, (DRISDOL) 50000 UNITS CAPS capsule Take 50,000 Units by mouth every 7 (seven) days. On mondays  STOP taking these medications     predniSONE (STERAPRED UNI-PAK) 10 MG tablet      triamcinolone acetonide (KENALOG) 40 MG/ML injection          TOTAL DISCHARGE TIME: 35 mins  [facility]icenterKRISHNAN,Idali Lafever  [facility] Pager [phone]  05/25/2013, 8:00 AM

## 2013-06-19 ENCOUNTER — Telehealth: Payer: Self-pay | Admitting: Internal Medicine

## 2013-06-19 NOTE — Telephone Encounter (Signed)
Pt says her MD's office did not notify her of appointment so she was unaware of it.

## 2013-06-19 NOTE — Telephone Encounter (Signed)
Message copied by Arna SnipeFRANK, YESENIA I on Tue Jun 19, 2013  4:36 PM ------      Message from: Richardson ChiquitoSMITH, DOROTHY N      Created: Wed May 16, 2013  8:21 AM                   ----- Message -----         From: Hart Carwinora M Brodie, MD         Sent: 05/15/2013   5:15 PM           To: Richardson Chiquitoorothy N Smith, CMA            OK to charge no  show      ----- Message -----         From: Richardson Chiquitoorothy N Smith, CMA         Sent: 05/15/2013   3:58 PM           To: Hart Carwinora M Brodie, MD            Patient no showed appointment with Dr Juanda ChanceBrodie for 05/15/13. Dr Juanda ChanceBrodie, do you want to charge no show fee?       ------

## 2013-12-31 ENCOUNTER — Other Ambulatory Visit: Payer: Self-pay

## 2013-12-31 DIAGNOSIS — Z1239 Encounter for other screening for malignant neoplasm of breast: Secondary | ICD-10-CM

## 2014-02-11 ENCOUNTER — Ambulatory Visit: Payer: BC Managed Care – PPO

## 2014-03-04 ENCOUNTER — Ambulatory Visit
Admission: RE | Admit: 2014-03-04 | Discharge: 2014-03-04 | Disposition: A | Discharge status: Home / Self Care | Payer: BC Managed Care – PPO | Source: Ambulatory Visit

## 2014-03-04 DIAGNOSIS — Z1239 Encounter for other screening for malignant neoplasm of breast: Secondary | ICD-10-CM

## 2015-01-30 ENCOUNTER — Other Ambulatory Visit: Payer: Self-pay

## 2015-01-30 DIAGNOSIS — Z1231 Encounter for screening mammogram for malignant neoplasm of breast: Secondary | ICD-10-CM

## 2015-02-23 IMAGING — CR DG CHEST 2V
2 series · 2 of 2 positions shown · non-contrast
Comparison: none

[w chest pa]
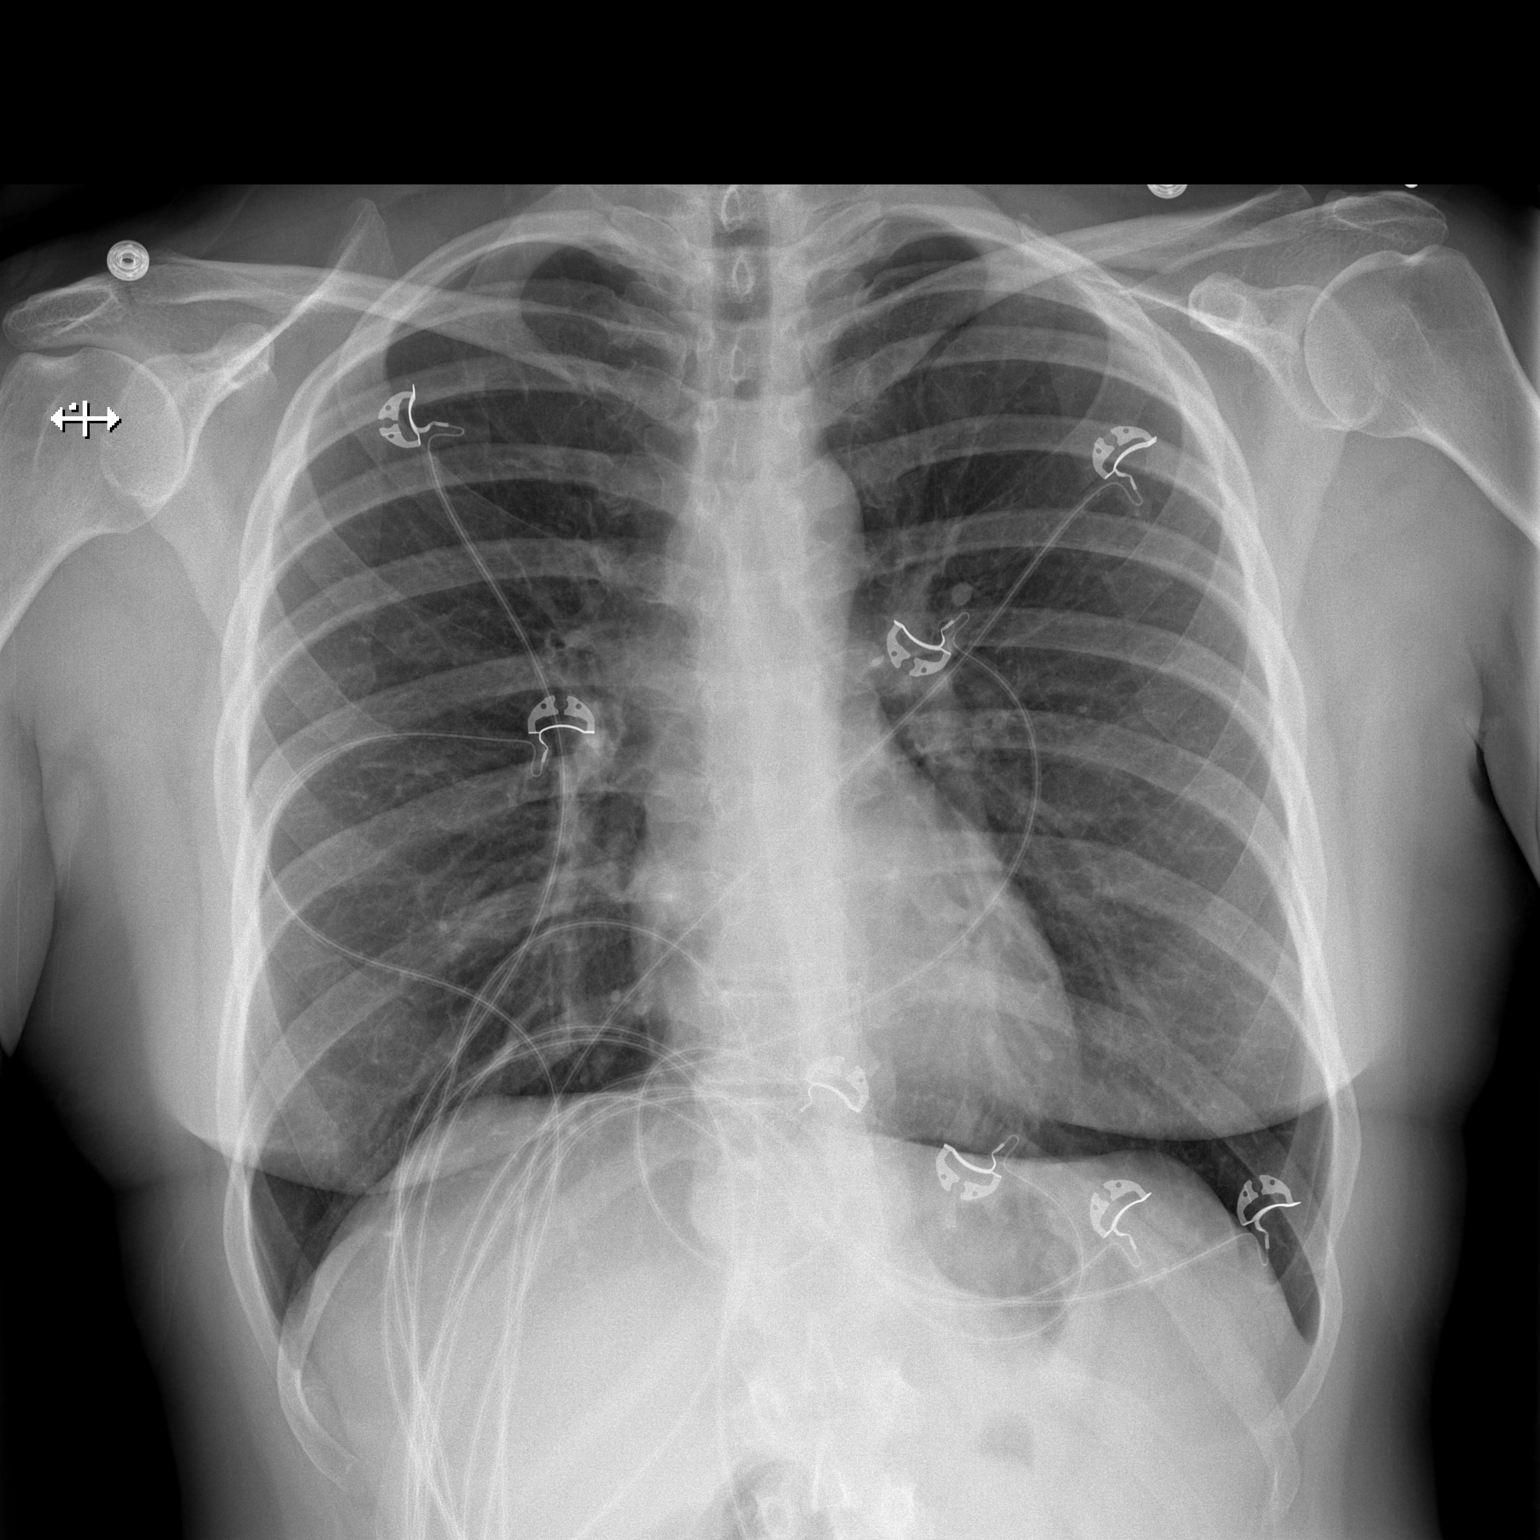

[w chest lat]
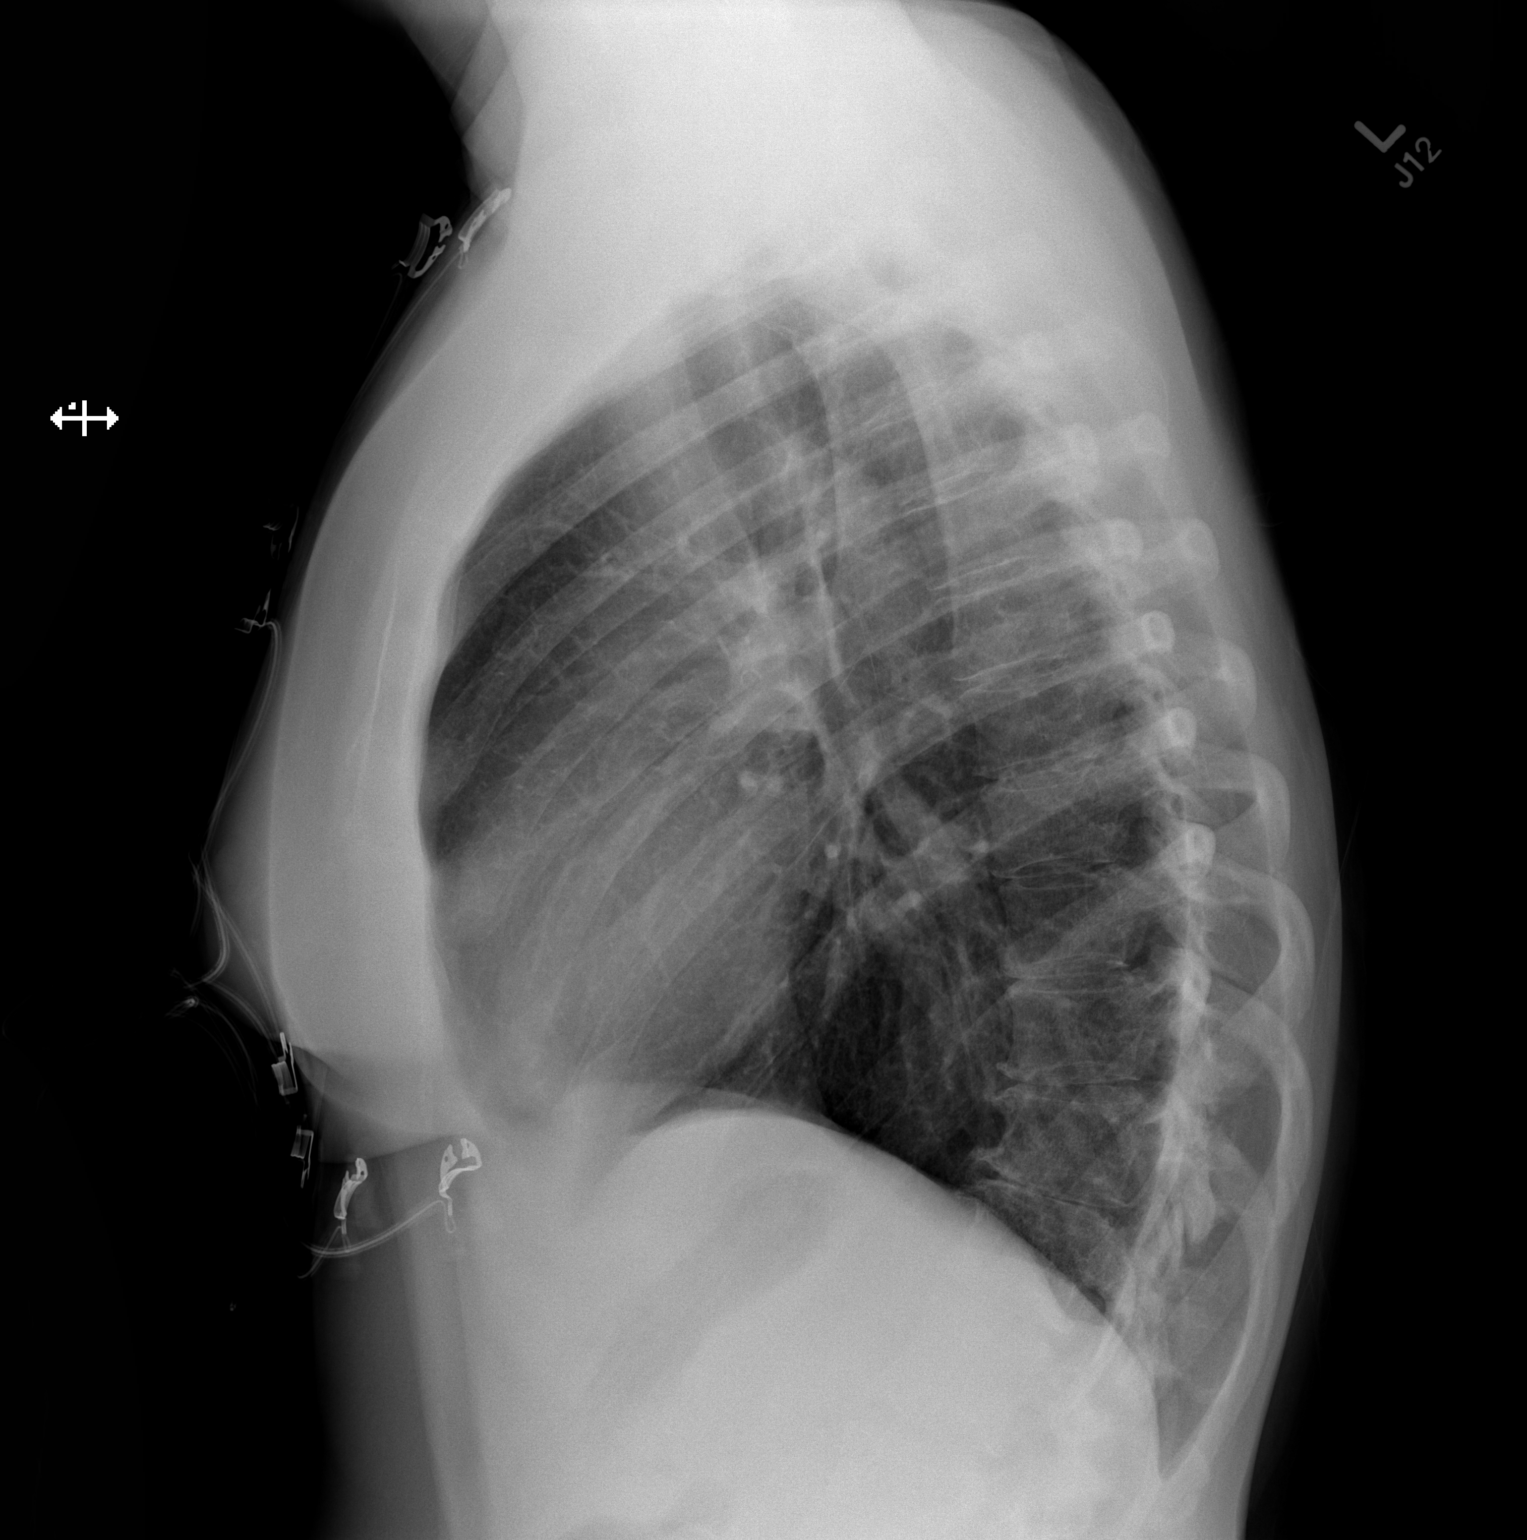

[2 of 2 positions shown; findings below may reference images not displayed]

CLINICAL DATA
Shortness of breath, cough, history asthma, hypertension

EXAM
CHEST  2 VIEW

COMPARISON
None

FINDINGS
Normal heart size, mediastinal contours, and pulmonary vascularity.

Lungs slightly hyperinflated but clear.

No pleural effusion or pneumothorax.

IMPRESSION
No acute abnormalities.

SIGNATURE

## 2015-03-07 ENCOUNTER — Ambulatory Visit
Admission: RE | Admit: 2015-03-07 | Discharge: 2015-03-07 | Disposition: A | Discharge status: Home / Self Care | Payer: BLUE CROSS/BLUE SHIELD | Source: Ambulatory Visit

## 2015-03-07 DIAGNOSIS — Z1231 Encounter for screening mammogram for malignant neoplasm of breast: Secondary | ICD-10-CM

## 2016-04-14 ENCOUNTER — Other Ambulatory Visit: Payer: Self-pay | Admitting: Internal Medicine

## 2016-04-14 DIAGNOSIS — Z1231 Encounter for screening mammogram for malignant neoplasm of breast: Secondary | ICD-10-CM

## 2016-04-19 ENCOUNTER — Ambulatory Visit
Admission: RE | Admit: 2016-04-19 | Discharge: 2016-04-19 | Disposition: A | Discharge status: Home / Self Care | Payer: BLUE CROSS/BLUE SHIELD | Source: Ambulatory Visit | Attending: Internal Medicine | Admitting: Internal Medicine

## 2016-04-19 DIAGNOSIS — Z1231 Encounter for screening mammogram for malignant neoplasm of breast: Secondary | ICD-10-CM

## 2017-01-13 DIAGNOSIS — M5032 Other cervical disc degeneration, mid-cervical region, unspecified level: Secondary | ICD-10-CM | POA: Diagnosis not present

## 2017-01-13 DIAGNOSIS — M9901 Segmental and somatic dysfunction of cervical region: Secondary | ICD-10-CM | POA: Diagnosis not present

## 2017-01-13 DIAGNOSIS — S161XXA Strain of muscle, fascia and tendon at neck level, initial encounter: Secondary | ICD-10-CM | POA: Diagnosis not present

## 2017-01-13 DIAGNOSIS — M9902 Segmental and somatic dysfunction of thoracic region: Secondary | ICD-10-CM | POA: Diagnosis not present

## 2017-01-17 DIAGNOSIS — M9902 Segmental and somatic dysfunction of thoracic region: Secondary | ICD-10-CM | POA: Diagnosis not present

## 2017-01-17 DIAGNOSIS — M9901 Segmental and somatic dysfunction of cervical region: Secondary | ICD-10-CM | POA: Diagnosis not present

## 2017-01-17 DIAGNOSIS — M5032 Other cervical disc degeneration, mid-cervical region, unspecified level: Secondary | ICD-10-CM | POA: Diagnosis not present

## 2017-01-17 DIAGNOSIS — S161XXA Strain of muscle, fascia and tendon at neck level, initial encounter: Secondary | ICD-10-CM | POA: Diagnosis not present

## 2017-01-20 DIAGNOSIS — M9902 Segmental and somatic dysfunction of thoracic region: Secondary | ICD-10-CM | POA: Diagnosis not present

## 2017-01-20 DIAGNOSIS — M5032 Other cervical disc degeneration, mid-cervical region, unspecified level: Secondary | ICD-10-CM | POA: Diagnosis not present

## 2017-01-20 DIAGNOSIS — S161XXA Strain of muscle, fascia and tendon at neck level, initial encounter: Secondary | ICD-10-CM | POA: Diagnosis not present

## 2017-01-20 DIAGNOSIS — M9901 Segmental and somatic dysfunction of cervical region: Secondary | ICD-10-CM | POA: Diagnosis not present

## 2017-01-24 DIAGNOSIS — S161XXA Strain of muscle, fascia and tendon at neck level, initial encounter: Secondary | ICD-10-CM | POA: Diagnosis not present

## 2017-01-24 DIAGNOSIS — M5032 Other cervical disc degeneration, mid-cervical region, unspecified level: Secondary | ICD-10-CM | POA: Diagnosis not present

## 2017-01-24 DIAGNOSIS — M9902 Segmental and somatic dysfunction of thoracic region: Secondary | ICD-10-CM | POA: Diagnosis not present

## 2017-01-24 DIAGNOSIS — M9901 Segmental and somatic dysfunction of cervical region: Secondary | ICD-10-CM | POA: Diagnosis not present

## 2017-01-27 DIAGNOSIS — Z23 Encounter for immunization: Secondary | ICD-10-CM | POA: Diagnosis not present

## 2017-01-27 DIAGNOSIS — H10021 Other mucopurulent conjunctivitis, right eye: Secondary | ICD-10-CM | POA: Diagnosis not present

## 2017-01-27 DIAGNOSIS — J028 Acute pharyngitis due to other specified organisms: Secondary | ICD-10-CM | POA: Diagnosis not present

## 2017-01-31 DIAGNOSIS — M5032 Other cervical disc degeneration, mid-cervical region, unspecified level: Secondary | ICD-10-CM | POA: Diagnosis not present

## 2017-01-31 DIAGNOSIS — M9901 Segmental and somatic dysfunction of cervical region: Secondary | ICD-10-CM | POA: Diagnosis not present

## 2017-01-31 DIAGNOSIS — M9902 Segmental and somatic dysfunction of thoracic region: Secondary | ICD-10-CM | POA: Diagnosis not present

## 2017-01-31 DIAGNOSIS — S161XXA Strain of muscle, fascia and tendon at neck level, initial encounter: Secondary | ICD-10-CM | POA: Diagnosis not present

## 2017-02-02 DIAGNOSIS — S161XXA Strain of muscle, fascia and tendon at neck level, initial encounter: Secondary | ICD-10-CM | POA: Diagnosis not present

## 2017-02-02 DIAGNOSIS — M5032 Other cervical disc degeneration, mid-cervical region, unspecified level: Secondary | ICD-10-CM | POA: Diagnosis not present

## 2017-02-02 DIAGNOSIS — M9901 Segmental and somatic dysfunction of cervical region: Secondary | ICD-10-CM | POA: Diagnosis not present

## 2017-02-02 DIAGNOSIS — M9902 Segmental and somatic dysfunction of thoracic region: Secondary | ICD-10-CM | POA: Diagnosis not present

## 2017-02-07 DIAGNOSIS — M9902 Segmental and somatic dysfunction of thoracic region: Secondary | ICD-10-CM | POA: Diagnosis not present

## 2017-02-07 DIAGNOSIS — M9901 Segmental and somatic dysfunction of cervical region: Secondary | ICD-10-CM | POA: Diagnosis not present

## 2017-02-07 DIAGNOSIS — M5032 Other cervical disc degeneration, mid-cervical region, unspecified level: Secondary | ICD-10-CM | POA: Diagnosis not present

## 2017-02-07 DIAGNOSIS — S161XXA Strain of muscle, fascia and tendon at neck level, initial encounter: Secondary | ICD-10-CM | POA: Diagnosis not present

## 2017-02-14 DIAGNOSIS — M5032 Other cervical disc degeneration, mid-cervical region, unspecified level: Secondary | ICD-10-CM | POA: Diagnosis not present

## 2017-02-14 DIAGNOSIS — S161XXA Strain of muscle, fascia and tendon at neck level, initial encounter: Secondary | ICD-10-CM | POA: Diagnosis not present

## 2017-02-14 DIAGNOSIS — M9901 Segmental and somatic dysfunction of cervical region: Secondary | ICD-10-CM | POA: Diagnosis not present

## 2017-02-14 DIAGNOSIS — M9902 Segmental and somatic dysfunction of thoracic region: Secondary | ICD-10-CM | POA: Diagnosis not present

## 2017-02-23 DIAGNOSIS — M9902 Segmental and somatic dysfunction of thoracic region: Secondary | ICD-10-CM | POA: Diagnosis not present

## 2017-02-23 DIAGNOSIS — M5032 Other cervical disc degeneration, mid-cervical region, unspecified level: Secondary | ICD-10-CM | POA: Diagnosis not present

## 2017-02-23 DIAGNOSIS — S161XXA Strain of muscle, fascia and tendon at neck level, initial encounter: Secondary | ICD-10-CM | POA: Diagnosis not present

## 2017-02-23 DIAGNOSIS — M9901 Segmental and somatic dysfunction of cervical region: Secondary | ICD-10-CM | POA: Diagnosis not present

## 2017-03-10 ENCOUNTER — Other Ambulatory Visit: Payer: Self-pay | Admitting: Internal Medicine

## 2017-03-10 DIAGNOSIS — Z1231 Encounter for screening mammogram for malignant neoplasm of breast: Secondary | ICD-10-CM

## 2017-03-23 DIAGNOSIS — M9902 Segmental and somatic dysfunction of thoracic region: Secondary | ICD-10-CM | POA: Diagnosis not present

## 2017-03-23 DIAGNOSIS — M9901 Segmental and somatic dysfunction of cervical region: Secondary | ICD-10-CM | POA: Diagnosis not present

## 2017-03-23 DIAGNOSIS — M5032 Other cervical disc degeneration, mid-cervical region, unspecified level: Secondary | ICD-10-CM | POA: Diagnosis not present

## 2017-04-05 DIAGNOSIS — Z6830 Body mass index (BMI) 30.0-30.9, adult: Secondary | ICD-10-CM | POA: Diagnosis not present

## 2017-04-05 DIAGNOSIS — E669 Obesity, unspecified: Secondary | ICD-10-CM | POA: Diagnosis not present

## 2017-04-05 DIAGNOSIS — Z0000 Encounter for general adult medical examination without abnormal findings: Secondary | ICD-10-CM | POA: Diagnosis not present

## 2017-04-05 DIAGNOSIS — Z1331 Encounter for screening for depression: Secondary | ICD-10-CM | POA: Diagnosis not present

## 2017-04-11 DIAGNOSIS — M7061 Trochanteric bursitis, right hip: Secondary | ICD-10-CM | POA: Diagnosis not present

## 2017-04-11 DIAGNOSIS — E669 Obesity, unspecified: Secondary | ICD-10-CM | POA: Diagnosis not present

## 2017-04-11 DIAGNOSIS — T63421A Toxic effect of venom of ants, accidental (unintentional), initial encounter: Secondary | ICD-10-CM | POA: Diagnosis not present

## 2017-04-11 DIAGNOSIS — Z6831 Body mass index (BMI) 31.0-31.9, adult: Secondary | ICD-10-CM | POA: Diagnosis not present

## 2017-04-13 ENCOUNTER — Other Ambulatory Visit: Payer: Self-pay | Admitting: Internal Medicine

## 2017-04-13 DIAGNOSIS — N644 Mastodynia: Secondary | ICD-10-CM

## 2017-04-13 DIAGNOSIS — N6452 Nipple discharge: Secondary | ICD-10-CM

## 2017-04-14 DIAGNOSIS — E559 Vitamin D deficiency, unspecified: Secondary | ICD-10-CM | POA: Diagnosis not present

## 2017-04-21 ENCOUNTER — Ambulatory Visit
Admission: RE | Admit: 2017-04-21 | Discharge: 2017-04-21 | Disposition: A | Discharge status: Home / Self Care | Payer: BLUE CROSS/BLUE SHIELD | Source: Ambulatory Visit | Attending: Internal Medicine | Admitting: Internal Medicine

## 2017-04-21 ENCOUNTER — Ambulatory Visit: Payer: BLUE CROSS/BLUE SHIELD

## 2017-04-21 DIAGNOSIS — N6452 Nipple discharge: Secondary | ICD-10-CM

## 2017-04-21 DIAGNOSIS — N644 Mastodynia: Secondary | ICD-10-CM

## 2017-04-21 DIAGNOSIS — R928 Other abnormal and inconclusive findings on diagnostic imaging of breast: Secondary | ICD-10-CM | POA: Diagnosis not present

## 2017-04-21 DIAGNOSIS — N6489 Other specified disorders of breast: Secondary | ICD-10-CM | POA: Diagnosis not present

## 2017-07-11 DIAGNOSIS — I10 Essential (primary) hypertension: Secondary | ICD-10-CM | POA: Diagnosis not present

## 2017-07-11 DIAGNOSIS — J309 Allergic rhinitis, unspecified: Secondary | ICD-10-CM | POA: Diagnosis not present

## 2017-07-11 DIAGNOSIS — E669 Obesity, unspecified: Secondary | ICD-10-CM | POA: Diagnosis not present

## 2017-07-11 DIAGNOSIS — Z6830 Body mass index (BMI) 30.0-30.9, adult: Secondary | ICD-10-CM | POA: Diagnosis not present

## 2017-08-16 DIAGNOSIS — E669 Obesity, unspecified: Secondary | ICD-10-CM | POA: Diagnosis not present

## 2017-08-16 DIAGNOSIS — R319 Hematuria, unspecified: Secondary | ICD-10-CM | POA: Diagnosis not present

## 2017-08-16 DIAGNOSIS — Z6830 Body mass index (BMI) 30.0-30.9, adult: Secondary | ICD-10-CM | POA: Diagnosis not present

## 2017-08-16 DIAGNOSIS — J309 Allergic rhinitis, unspecified: Secondary | ICD-10-CM | POA: Diagnosis not present

## 2017-11-03 DIAGNOSIS — Z1339 Encounter for screening examination for other mental health and behavioral disorders: Secondary | ICD-10-CM | POA: Diagnosis not present

## 2017-11-03 DIAGNOSIS — I10 Essential (primary) hypertension: Secondary | ICD-10-CM | POA: Diagnosis not present

## 2017-11-03 DIAGNOSIS — E559 Vitamin D deficiency, unspecified: Secondary | ICD-10-CM | POA: Diagnosis not present

## 2017-11-03 DIAGNOSIS — Z6830 Body mass index (BMI) 30.0-30.9, adult: Secondary | ICD-10-CM | POA: Diagnosis not present

## 2017-11-03 DIAGNOSIS — E669 Obesity, unspecified: Secondary | ICD-10-CM | POA: Diagnosis not present

## 2017-11-03 DIAGNOSIS — E785 Hyperlipidemia, unspecified: Secondary | ICD-10-CM | POA: Diagnosis not present

## 2017-11-03 DIAGNOSIS — Z79899 Other long term (current) drug therapy: Secondary | ICD-10-CM | POA: Diagnosis not present

## 2017-12-22 DIAGNOSIS — Z6830 Body mass index (BMI) 30.0-30.9, adult: Secondary | ICD-10-CM | POA: Diagnosis not present

## 2017-12-22 DIAGNOSIS — B373 Candidiasis of vulva and vagina: Secondary | ICD-10-CM | POA: Diagnosis not present

## 2017-12-22 DIAGNOSIS — N390 Urinary tract infection, site not specified: Secondary | ICD-10-CM | POA: Diagnosis not present

## 2017-12-22 DIAGNOSIS — L959 Vasculitis limited to the skin, unspecified: Secondary | ICD-10-CM | POA: Diagnosis not present

## 2018-03-22 DIAGNOSIS — L814 Other melanin hyperpigmentation: Secondary | ICD-10-CM | POA: Diagnosis not present

## 2018-03-22 DIAGNOSIS — L570 Actinic keratosis: Secondary | ICD-10-CM | POA: Diagnosis not present

## 2018-03-22 DIAGNOSIS — D485 Neoplasm of uncertain behavior of skin: Secondary | ICD-10-CM | POA: Diagnosis not present

## 2018-03-27 ENCOUNTER — Other Ambulatory Visit: Payer: Self-pay | Admitting: Internal Medicine

## 2018-03-27 DIAGNOSIS — Z1231 Encounter for screening mammogram for malignant neoplasm of breast: Secondary | ICD-10-CM

## 2018-05-01 ENCOUNTER — Ambulatory Visit
Admission: RE | Admit: 2018-05-01 | Discharge: 2018-05-01 | Disposition: A | Discharge status: Home / Self Care | Payer: BLUE CROSS/BLUE SHIELD | Source: Ambulatory Visit | Attending: Internal Medicine | Admitting: Internal Medicine

## 2018-05-01 DIAGNOSIS — E785 Hyperlipidemia, unspecified: Secondary | ICD-10-CM | POA: Diagnosis not present

## 2018-05-01 DIAGNOSIS — Z6830 Body mass index (BMI) 30.0-30.9, adult: Secondary | ICD-10-CM | POA: Diagnosis not present

## 2018-05-01 DIAGNOSIS — Z1231 Encounter for screening mammogram for malignant neoplasm of breast: Secondary | ICD-10-CM | POA: Diagnosis not present

## 2018-05-01 DIAGNOSIS — Z23 Encounter for immunization: Secondary | ICD-10-CM | POA: Diagnosis not present

## 2018-05-01 DIAGNOSIS — Z124 Encounter for screening for malignant neoplasm of cervix: Secondary | ICD-10-CM | POA: Diagnosis not present

## 2018-05-01 DIAGNOSIS — Z0000 Encounter for general adult medical examination without abnormal findings: Secondary | ICD-10-CM | POA: Diagnosis not present

## 2018-05-01 DIAGNOSIS — Z1331 Encounter for screening for depression: Secondary | ICD-10-CM | POA: Diagnosis not present

## 2018-06-30 DIAGNOSIS — J309 Allergic rhinitis, unspecified: Secondary | ICD-10-CM | POA: Diagnosis not present

## 2018-06-30 DIAGNOSIS — I10 Essential (primary) hypertension: Secondary | ICD-10-CM | POA: Diagnosis not present

## 2018-06-30 DIAGNOSIS — M545 Low back pain: Secondary | ICD-10-CM | POA: Diagnosis not present

## 2018-07-10 DIAGNOSIS — M238X1 Other internal derangements of right knee: Secondary | ICD-10-CM | POA: Diagnosis not present

## 2018-07-13 DIAGNOSIS — M25561 Pain in right knee: Secondary | ICD-10-CM | POA: Diagnosis not present

## 2018-07-17 ENCOUNTER — Encounter (HOSPITAL_BASED_OUTPATIENT_CLINIC_OR_DEPARTMENT_OTHER): Payer: Self-pay | Admitting: Physician Assistant

## 2018-07-17 ENCOUNTER — Other Ambulatory Visit: Payer: Self-pay

## 2018-07-17 DIAGNOSIS — I10 Essential (primary) hypertension: Secondary | ICD-10-CM | POA: Diagnosis present

## 2018-07-17 DIAGNOSIS — M25561 Pain in right knee: Secondary | ICD-10-CM | POA: Diagnosis not present

## 2018-07-17 DIAGNOSIS — J45909 Unspecified asthma, uncomplicated: Secondary | ICD-10-CM | POA: Diagnosis present

## 2018-07-17 DIAGNOSIS — S83241A Other tear of medial meniscus, current injury, right knee, initial encounter: Secondary | ICD-10-CM

## 2018-07-17 HISTORY — DX: Other tear of medial meniscus, current injury, right knee, initial encounter: S83.241A

## 2018-07-17 NOTE — H&P (Signed)
Laura Pope is an 48 y.o. female.   Chief Complaint: right knee  HPI: Laura Sharol HarnessSimmons is a 48 year old new patient here for evaluation of right knee symptoms that began 06/29/2018.  She was leaving a building going down some steps when she twisted and heard and felt a pop in her knee.  Since then she has had significant pain, swelling and inability to get full extension.  Most of her pain is posteromedial.  She does have a history of some off and on mild symptoms in that right knee and does have a previous history of patellar dislocation as a teenager, but this did not feel like that.  Additional review of systems and history detailed.  She has had a history of kidney stones.     Past Medical History:  Diagnosis Date  . Acute medial meniscus tear of right knee 07/17/2018  . Allergic rhinitis   . Arthritis of elbow, left   . Asthma   . External hemorrhoids   . Hypertension   . Interstitial cystitis   . Lateral epicondylitis of left elbow   . Overactive bladder   . Pedal edema   . Renal calculi     Past Surgical History:  Procedure Laterality Date  . LEFT KNEE  SURGERY    . none    . WISDOM TOOTH EXTRACTION      Family History  Problem Relation Age of Onset  . Colon polyps Mother   . Diabetes Mother   . Hyperlipidemia Mother   . Hypertension Mother   . Heart disease Mother   . Thyroid disease Mother   . Arthritis Mother   . Colon cancer Other   . Hyperlipidemia Other   . Asthma Other   . Thyroid disease Other   . Diabetes Other   . Heart disease Other   . Stroke Other   . Asthma Sister   . Hyperlipidemia Father   . Heart attack Father    Social History:  reports that she has never smoked. She has never used smokeless tobacco. She reports that she does not drink alcohol or use drugs.  Allergies:  Allergies  Allergen Reactions  . Aspirin Swelling    Throat swelling  . Mushroom Extract Complex Swelling  . Contrast Media [Iodinated Diagnostic Agents] Itching and Rash   . Oxycodone Rash    Developed an itchy rash 24 hours after taking Percocet. She had no problems with her airway.  . Penicillins Swelling and Rash  . Vicodin [Hydrocodone-Acetaminophen] Swelling and Rash   MEDS: She is currently on Lisinopril, Singulair, rosuvastatin and Vitamin D.  No results found for this or any previous visit (from the past 48 hour(s)). No results found.  Review of Systems  Constitutional: Negative.   HENT: Negative.   Eyes: Negative.   Respiratory: Negative.   Cardiovascular: Negative.   Gastrointestinal: Negative.   Genitourinary: Negative.   Musculoskeletal: Positive for joint pain.  Skin: Negative.   Neurological: Negative.   Endo/Heme/Allergies: Negative.   Psychiatric/Behavioral: Negative.     Height 5\' 6"  (1.676 m), weight 88.5 kg. Physical Exam  Constitutional: She is oriented to person, place, and time. She appears well-developed and well-nourished.  HENT:  Head: Normocephalic and atraumatic.  Mouth/Throat: Oropharynx is clear and moist.  Eyes: Pupils are equal, round, and reactive to light. EOM are normal.  Neck: Neck supple.  Cardiovascular: Normal rate and regular rhythm.  Respiratory: Effort normal and breath sounds normal.  GI: Soft. Bowel sounds are normal.  Genitourinary:    Genitourinary Comments: Not pertinent to current symptomatology therefore not examined.   Musculoskeletal:     Comments: She has 3+ swelling of the right knee.  No warmth or erythema.  Tender along the posteromedial joint line.  She lacks full extension by about 20 degrees and it is painful to try to push past that point.  Flexion stops at about 100 degrees with pain.  The knee is stable in all directions.  Painful with McMurray's maneuver.  Left knee exam is unremarkable.    Neurological: She is alert and oriented to person, place, and time.  Skin: Skin is warm and dry.  Psychiatric: She has a normal mood and affect. Her behavior is normal.      Assessment Principal Problem:   Acute medial meniscus tear of right knee Active Problems:   Hypertension   Asthma   Plan Right knee arthroscopy with partial medial meniscectomy.   The risks, benefits, and possible complications of the procedure were discussed in detail with the patient.  The patient is without question.  Demar Shad J Neytiri Asche, PA-C 07/17/2018, 4:19 PM

## 2018-07-18 ENCOUNTER — Encounter (HOSPITAL_BASED_OUTPATIENT_CLINIC_OR_DEPARTMENT_OTHER)
Admission: RE | Admit: 2018-07-18 | Discharge: 2018-07-18 | Disposition: A | Discharge status: Home / Self Care | Payer: BLUE CROSS/BLUE SHIELD | Source: Ambulatory Visit | Attending: Orthopedic Surgery | Admitting: Orthopedic Surgery

## 2018-07-18 DIAGNOSIS — S83281A Other tear of lateral meniscus, current injury, right knee, initial encounter: Secondary | ICD-10-CM | POA: Diagnosis not present

## 2018-07-18 DIAGNOSIS — X501XXA Overexertion from prolonged static or awkward postures, initial encounter: Secondary | ICD-10-CM | POA: Diagnosis not present

## 2018-07-18 DIAGNOSIS — I10 Essential (primary) hypertension: Secondary | ICD-10-CM | POA: Diagnosis not present

## 2018-07-18 DIAGNOSIS — Z01812 Encounter for preprocedural laboratory examination: Secondary | ICD-10-CM | POA: Diagnosis not present

## 2018-07-18 DIAGNOSIS — J45909 Unspecified asthma, uncomplicated: Secondary | ICD-10-CM | POA: Diagnosis not present

## 2018-07-18 DIAGNOSIS — M19022 Primary osteoarthritis, left elbow: Secondary | ICD-10-CM | POA: Diagnosis not present

## 2018-07-18 DIAGNOSIS — S83241A Other tear of medial meniscus, current injury, right knee, initial encounter: Secondary | ICD-10-CM | POA: Diagnosis not present

## 2018-07-18 DIAGNOSIS — Z79899 Other long term (current) drug therapy: Secondary | ICD-10-CM | POA: Diagnosis not present

## 2018-07-18 LAB — BASIC METABOLIC PANEL
Anion gap: 8 (ref 5–15)
BUN: 16 mg/dL (ref 6–20)
CO2: 27 mmol/L (ref 22–32)
Calcium: 8.9 mg/dL (ref 8.9–10.3)
Chloride: 101 mmol/L (ref 98–111)
Creatinine, Ser: 0.76 mg/dL (ref 0.44–1.00)
GFR calc Af Amer: >60 mL/min (ref >60)
GFR calc non Af Amer: >60 mL/min (ref >60)
Glucose, Bld: 96 mg/dL (ref 70–99)
Potassium: 4.9 mmol/L (ref 3.5–5.1)
Sodium: 136 mmol/L (ref 135–145)

## 2018-07-18 LAB — APTT: aPTT: 30 seconds (ref 24–36)

## 2018-07-18 NOTE — Progress Notes (Signed)
Ensure pre surgery drink given with instructions to complete by 0845 dos, pt verbalized understanding. 

## 2018-07-19 ENCOUNTER — Other Ambulatory Visit: Payer: Self-pay

## 2018-07-19 ENCOUNTER — Ambulatory Visit (HOSPITAL_BASED_OUTPATIENT_CLINIC_OR_DEPARTMENT_OTHER)
Admission: RE | Admit: 2018-07-19 | Discharge: 2018-07-19 | Disposition: A | Discharge status: Home / Self Care | Payer: BLUE CROSS/BLUE SHIELD | Attending: Orthopedic Surgery | Admitting: Orthopedic Surgery

## 2018-07-19 ENCOUNTER — Ambulatory Visit (HOSPITAL_BASED_OUTPATIENT_CLINIC_OR_DEPARTMENT_OTHER): Payer: BLUE CROSS/BLUE SHIELD | Admitting: Anesthesiology

## 2018-07-19 ENCOUNTER — Encounter (HOSPITAL_BASED_OUTPATIENT_CLINIC_OR_DEPARTMENT_OTHER): Payer: Self-pay

## 2018-07-19 ENCOUNTER — Encounter (HOSPITAL_BASED_OUTPATIENT_CLINIC_OR_DEPARTMENT_OTHER)
Admission: RE | Disposition: A | Discharge status: Home / Self Care | Payer: Self-pay | Source: Home / Self Care | Attending: Orthopedic Surgery

## 2018-07-19 DIAGNOSIS — S83241A Other tear of medial meniscus, current injury, right knee, initial encounter: Secondary | ICD-10-CM | POA: Diagnosis present

## 2018-07-19 DIAGNOSIS — S83281A Other tear of lateral meniscus, current injury, right knee, initial encounter: Secondary | ICD-10-CM | POA: Insufficient documentation

## 2018-07-19 DIAGNOSIS — I10 Essential (primary) hypertension: Secondary | ICD-10-CM | POA: Diagnosis present

## 2018-07-19 DIAGNOSIS — S83211A Bucket-handle tear of medial meniscus, current injury, right knee, initial encounter: Secondary | ICD-10-CM | POA: Diagnosis not present

## 2018-07-19 DIAGNOSIS — Z79899 Other long term (current) drug therapy: Secondary | ICD-10-CM | POA: Insufficient documentation

## 2018-07-19 DIAGNOSIS — M19022 Primary osteoarthritis, left elbow: Secondary | ICD-10-CM | POA: Insufficient documentation

## 2018-07-19 DIAGNOSIS — G8918 Other acute postprocedural pain: Secondary | ICD-10-CM | POA: Diagnosis not present

## 2018-07-19 DIAGNOSIS — Z01812 Encounter for preprocedural laboratory examination: Secondary | ICD-10-CM | POA: Insufficient documentation

## 2018-07-19 DIAGNOSIS — J45909 Unspecified asthma, uncomplicated: Secondary | ICD-10-CM | POA: Diagnosis present

## 2018-07-19 DIAGNOSIS — M94261 Chondromalacia, right knee: Secondary | ICD-10-CM | POA: Diagnosis not present

## 2018-07-19 DIAGNOSIS — X501XXA Overexertion from prolonged static or awkward postures, initial encounter: Secondary | ICD-10-CM | POA: Insufficient documentation

## 2018-07-19 HISTORY — DX: Other tear of medial meniscus, current injury, right knee, initial encounter: S83.241A

## 2018-07-19 HISTORY — PX: KNEE ARTHROSCOPY WITH MEDIAL MENISECTOMY: SHX5651

## 2018-07-19 SURGERY — ARTHROSCOPY, KNEE, WITH MEDIAL MENISCECTOMY
Anesthesia: General | Site: Knee | Laterality: Right

## 2018-07-19 MED ORDER — ONDANSETRON HCL 4 MG/2ML IJ SOLN
INTRAMUSCULAR | Status: AC
  Filled 2018-07-19: qty 2

## 2018-07-19 MED ORDER — KETOROLAC TROMETHAMINE 30 MG/ML IJ SOLN
INTRAMUSCULAR | Status: DC | PRN
  Administered 2018-07-19: 30 mg via INTRAVENOUS

## 2018-07-19 MED ORDER — MIDAZOLAM HCL 2 MG/2ML IJ SOLN
INTRAMUSCULAR | Status: AC
  Filled 2018-07-19: qty 2

## 2018-07-19 MED ORDER — ONDANSETRON HCL 4 MG/2ML IJ SOLN: 4.0000 mg | Freq: Once | INTRAMUSCULAR | Status: DC | PRN

## 2018-07-19 MED ORDER — VANCOMYCIN HCL IN DEXTROSE 1-5 GM/200ML-% IV SOLN
INTRAVENOUS | Status: AC
  Filled 2018-07-19: qty 200

## 2018-07-19 MED ORDER — LIDOCAINE HCL (CARDIAC) PF 100 MG/5ML IV SOSY
PREFILLED_SYRINGE | INTRAVENOUS | Status: DC | PRN
  Administered 2018-07-19: 40 mg via INTRAVENOUS

## 2018-07-19 MED ORDER — EPINEPHRINE 1 MG/10ML IJ SOSY
PREFILLED_SYRINGE | INTRAMUSCULAR | Status: DC | PRN
  Administered 2018-07-19: 0.1 mg via INTRAVENOUS

## 2018-07-19 MED ORDER — LACTATED RINGERS IV SOLN: INTRAVENOUS | Status: DC

## 2018-07-19 MED ORDER — OXYCODONE HCL 5 MG PO TABS: 5.0000 mg | ORAL_TABLET | Freq: Once | ORAL | Status: DC | PRN

## 2018-07-19 MED ORDER — ACETAMINOPHEN 160 MG/5ML PO SOLN: 325.0000 mg | ORAL | Status: DC | PRN

## 2018-07-19 MED ORDER — HYDROMORPHONE HCL 2 MG PO TABS: 2.0000 mg | ORAL_TABLET | ORAL | 0 refills | Status: DC | PRN

## 2018-07-19 MED ORDER — HYDROMORPHONE HCL 2 MG PO TABS
ORAL_TABLET | ORAL | Status: AC
  Filled 2018-07-19: qty 1

## 2018-07-19 MED ORDER — VANCOMYCIN HCL IN DEXTROSE 1-5 GM/200ML-% IV SOLN
1000.0000 mg | INTRAVENOUS | Status: AC
  Administered 2018-07-19: 1000 mg via INTRAVENOUS

## 2018-07-19 MED ORDER — MIDAZOLAM HCL 2 MG/2ML IJ SOLN
1.0000 mg | INTRAMUSCULAR | Status: DC | PRN
  Administered 2018-07-19 (×2): 2 mg via INTRAVENOUS

## 2018-07-19 MED ORDER — FENTANYL CITRATE (PF) 100 MCG/2ML IJ SOLN
INTRAMUSCULAR | Status: AC
  Filled 2018-07-19: qty 2

## 2018-07-19 MED ORDER — POVIDONE-IODINE 10 % EX SWAB: 2.0000 "application " | Freq: Once | CUTANEOUS | Status: DC

## 2018-07-19 MED ORDER — OXYCODONE HCL 5 MG/5ML PO SOLN: 5.0000 mg | Freq: Once | ORAL | Status: DC | PRN

## 2018-07-19 MED ORDER — MEPERIDINE HCL 25 MG/ML IJ SOLN: 6.2500 mg | INTRAMUSCULAR | Status: DC | PRN

## 2018-07-19 MED ORDER — ROPIVACAINE HCL 7.5 MG/ML IJ SOLN
INTRAMUSCULAR | Status: DC | PRN
  Administered 2018-07-19: 30 mL

## 2018-07-19 MED ORDER — POVIDONE-IODINE 7.5 % EX SOLN: Freq: Once | CUTANEOUS | Status: DC

## 2018-07-19 MED ORDER — FENTANYL CITRATE (PF) 100 MCG/2ML IJ SOLN
50.0000 ug | INTRAMUSCULAR | Status: DC | PRN
  Administered 2018-07-19: 50 ug via INTRAVENOUS
  Administered 2018-07-19: 100 ug via INTRAVENOUS

## 2018-07-19 MED ORDER — ACETAMINOPHEN 325 MG PO TABS: 325.0000 mg | ORAL_TABLET | ORAL | Status: DC | PRN

## 2018-07-19 MED ORDER — PROPOFOL 10 MG/ML IV BOLUS
INTRAVENOUS | Status: DC | PRN
  Administered 2018-07-19: 200 mg via INTRAVENOUS

## 2018-07-19 MED ORDER — DEXAMETHASONE SODIUM PHOSPHATE 10 MG/ML IJ SOLN
INTRAMUSCULAR | Status: AC
  Filled 2018-07-19: qty 1

## 2018-07-19 MED ORDER — DEXAMETHASONE SODIUM PHOSPHATE 10 MG/ML IJ SOLN
8.0000 mg | Freq: Once | INTRAMUSCULAR | Status: AC
  Administered 2018-07-19: 8 mg via INTRAVENOUS

## 2018-07-19 MED ORDER — DEXAMETHASONE SODIUM PHOSPHATE 10 MG/ML IJ SOLN
INTRAMUSCULAR | Status: DC | PRN
  Administered 2018-07-19: 10 mg via INTRAVENOUS

## 2018-07-19 MED ORDER — LACTATED RINGERS IV SOLN
INTRAVENOUS | Status: DC
  Administered 2018-07-19: 15:00:00 via INTRAVENOUS
  Administered 2018-07-19: 10 mL/h via INTRAVENOUS

## 2018-07-19 MED ORDER — HYDROMORPHONE HCL 2 MG PO TABS
2.0000 mg | ORAL_TABLET | Freq: Once | ORAL | Status: AC
  Administered 2018-07-19: 2 mg via ORAL

## 2018-07-19 MED ORDER — FENTANYL CITRATE (PF) 100 MCG/2ML IJ SOLN
25.0000 ug | INTRAMUSCULAR | Status: DC | PRN
  Administered 2018-07-19: 50 ug via INTRAVENOUS
  Administered 2018-07-19: 25 ug via INTRAVENOUS

## 2018-07-19 MED ORDER — PROPOFOL 10 MG/ML IV BOLUS
INTRAVENOUS | Status: AC
  Filled 2018-07-19: qty 20

## 2018-07-19 MED ORDER — SCOPOLAMINE 1 MG/3DAYS TD PT72: 1.0000 | MEDICATED_PATCH | Freq: Once | TRANSDERMAL | Status: DC | PRN

## 2018-07-19 SURGICAL SUPPLY — 39 items
BANDAGE ACE 6X5 VEL STRL LF (GAUZE/BANDAGES/DRESSINGS) ×3 IMPLANT
BNDG COHESIVE 4X5 TAN STRL (GAUZE/BANDAGES/DRESSINGS) IMPLANT
COVER WAND RF STERILE (DRAPES) IMPLANT
DISSECTOR  3.8MM X 13CM (MISCELLANEOUS)
DISSECTOR 3.8MM X 13CM (MISCELLANEOUS) IMPLANT
DISSECTOR 4.0MMX13CM CVD (MISCELLANEOUS) ×2 IMPLANT
DRAPE ARTHROSCOPY W/POUCH 90 (DRAPES) ×3 IMPLANT
DURAPREP 26ML APPLICATOR (WOUND CARE) ×1 IMPLANT
EXCALIBUR 3.8MM X 13CM (MISCELLANEOUS) ×2 IMPLANT
GAUZE SPONGE 4X4 12PLY STRL (GAUZE/BANDAGES/DRESSINGS) ×3 IMPLANT
GAUZE XEROFORM 1X8 LF (GAUZE/BANDAGES/DRESSINGS) ×3 IMPLANT
GLOVE BIO SURGEON STRL SZ7 (GLOVE) ×5 IMPLANT
GLOVE BIOGEL PI IND STRL 7.0 (GLOVE) ×1 IMPLANT
GLOVE BIOGEL PI IND STRL 7.5 (GLOVE) ×1 IMPLANT
GLOVE BIOGEL PI INDICATOR 7.0 (GLOVE) ×6
GLOVE BIOGEL PI INDICATOR 7.5 (GLOVE) ×2
GLOVE SS BIOGEL STRL SZ 7.5 (GLOVE) ×1 IMPLANT
GLOVE SUPERSENSE BIOGEL SZ 7.5 (GLOVE) ×2
GOWN STRL REUS W/ TWL LRG LVL3 (GOWN DISPOSABLE) ×2 IMPLANT
GOWN STRL REUS W/ TWL XL LVL3 (GOWN DISPOSABLE) ×1 IMPLANT
GOWN STRL REUS W/TWL LRG LVL3 (GOWN DISPOSABLE) ×6
GOWN STRL REUS W/TWL XL LVL3 (GOWN DISPOSABLE) ×3
KNEE WRAP E Z 3 GEL PACK (MISCELLANEOUS) ×3 IMPLANT
MANIFOLD NEPTUNE II (INSTRUMENTS) IMPLANT
NDL SAFETY ECLIPSE 18X1.5 (NEEDLE) ×2 IMPLANT
NEEDLE HYPO 18GX1.5 SHARP (NEEDLE) ×6
NEEDLE HYPO 22GX1.5 SAFETY (NEEDLE) ×3 IMPLANT
PACK ARTHROSCOPY DSU (CUSTOM PROCEDURE TRAY) ×3 IMPLANT
PACK BASIN DAY SURGERY FS (CUSTOM PROCEDURE TRAY) ×3 IMPLANT
PAD ALCOHOL SWAB (MISCELLANEOUS) IMPLANT
PENCIL BUTTON HOLSTER BLD 10FT (ELECTRODE) ×3 IMPLANT
PORT APPOLLO RF 90DEGREE MULTI (SURGICAL WAND) IMPLANT
SUT ETHILON 4 0 PS 2 18 (SUTURE) IMPLANT
SUT PROLENE 3 0 PS 2 (SUTURE) IMPLANT
SYR 20CC LL (SYRINGE) IMPLANT
SYR 5ML LL (SYRINGE) ×3 IMPLANT
TOWEL GREEN STERILE FF (TOWEL DISPOSABLE) ×3 IMPLANT
TUBING ARTHROSCOPY IRRIG 16FT (MISCELLANEOUS) ×3 IMPLANT
WATER STERILE IRR 1000ML POUR (IV SOLUTION) ×3 IMPLANT

## 2018-07-19 NOTE — Anesthesia Preprocedure Evaluation (Addendum)
Anesthesia Evaluation  Patient identified by MRN, date of birth, ID band Patient awake    Reviewed: Allergy & Precautions, H&P , NPO status , Patient's Chart, lab work & pertinent test results, reviewed documented beta blocker date and time   Airway Mallampati: II  TM Distance: >3 FB Neck ROM: full    Dental no notable dental hx.    Pulmonary asthma ,    Pulmonary exam normal breath sounds clear to auscultation       Cardiovascular Exercise Tolerance: Good hypertension, Pt. on medications negative cardio ROS   Rhythm:regular Rate:Normal     Neuro/Psych negative neurological ROS  negative psych ROS   GI/Hepatic negative GI ROS, Neg liver ROS,   Endo/Other  negative endocrine ROS  Renal/GU negative Renal ROSHx renal calculus  negative genitourinary   Musculoskeletal  (+) Arthritis , Osteoarthritis,    Abdominal   Peds  Hematology negative hematology ROS (+)   Anesthesia Other Findings   Reproductive/Obstetrics negative OB ROS                            Anesthesia Physical Anesthesia Plan  ASA: II  Anesthesia Plan: General   Post-op Pain Management: GA combined w/ Regional for post-op pain   Induction: Intravenous  PONV Risk Score and Plan: 3 and Ondansetron, Treatment may vary due to age or medical condition and Dexamethasone  Airway Management Planned: LMA  Additional Equipment:   Intra-op Plan:   Post-operative Plan:   Informed Consent: I have reviewed the patients History and Physical, chart, labs and discussed the procedure including the risks, benefits and alternatives for the proposed anesthesia with the patient or authorized representative who has indicated his/her understanding and acceptance.     Dental Advisory Given  Plan Discussed with: CRNA, Anesthesiologist and Surgeon  Anesthesia Plan Comments: ( )        Anesthesia Quick Evaluation

## 2018-07-19 NOTE — Transfer of Care (Signed)
Immediate Anesthesia Transfer of Care Note  Patient: Laura Pope  Procedure(s) Performed: KNEE ARTHROSCOPY WITH MEDIAL MENISECTOMY (Right Knee)  Patient Location: PACU  Anesthesia Type:General  Level of Consciousness: sedated  Airway & Oxygen Therapy: Patient Spontanous Breathing and Patient connected to nasal cannula oxygen  Post-op Assessment: Report given to RN and Post -op Vital signs reviewed and stable  Post vital signs: Reviewed and stable  Last Vitals:  Vitals Value Taken Time  BP 89/49 07/19/2018  2:06 PM  Temp    Pulse 63 07/19/2018  2:09 PM  Resp 13 07/19/2018  2:09 PM  SpO2 98 % 07/19/2018  2:09 PM  Vitals shown include unvalidated device data.  Last Pain:  Vitals:   07/19/18 1103  TempSrc: Oral  PainSc: 2       Patients Stated Pain Goal: 3 (07/19/18 1103)  Complications: No apparent anesthesia complications

## 2018-07-19 NOTE — Progress Notes (Signed)
Assisted Dr. Oddono with right, knee block. Side rails up, monitors on throughout procedure. See vital signs in flow sheet. Tolerated Procedure well. 

## 2018-07-19 NOTE — Discharge Instructions (Signed)
  Post Anesthesia Home Care Instructions  Activity: Get plenty of rest for the remainder of the day. A responsible individual must stay with you for 24 hours following the procedure.  For the next 24 hours, DO NOT: -Drive a car -Operate machinery -Drink alcoholic beverages -Take any medication unless instructed by your physician -Make any legal decisions or sign important papers.  Meals: Start with liquid foods such as gelatin or soup. Progress to regular foods as tolerated. Avoid greasy, spicy, heavy foods. If nausea and/or vomiting occur, drink only clear liquids until the nausea and/or vomiting subsides. Call your physician if vomiting continues.  Special Instructions/Symptoms: Your throat may feel dry or sore from the anesthesia or the breathing tube placed in your throat during surgery. If this causes discomfort, gargle with warm salt water. The discomfort should disappear within 24 hours.     Regional Anesthesia Blocks  1. Numbness or the inability to move the "blocked" extremity may last from 3-48 hours after placement. The length of time depends on the medication injected and your individual response to the medication. If the numbness is not going away after 48 hours, call your surgeon.  2. The extremity that is blocked will need to be protected until the numbness is gone and the  Strength has returned. Because you cannot feel it, you will need to take extra care to avoid injury. Because it may be weak, you may have difficulty moving it or using it. You may not know what position it is in without looking at it while the block is in effect.  3. For blocks in the legs and feet, returning to weight bearing and walking needs to be done carefully. You will need to wait until the numbness is entirely gone and the strength has returned. You should be able to move your leg and foot normally before you try and bear weight or walk. You will need someone to be with you when you first try to  ensure you do not fall and possibly risk injury.  4. Bruising and tenderness at the needle site are common side effects and will resolve in a few days.  5. Persistent numbness or new problems with movement should be communicated to the surgeon or the [facility] ([phone])/ [facility] ([phone]). 

## 2018-07-19 NOTE — Anesthesia Procedure Notes (Signed)
Anesthesia Regional Block: Knee block   Pre-Anesthetic Checklist: ,, timeout performed, Correct Patient, Correct Site, Correct Laterality, Correct Procedure, Correct Position, site marked, Risks and benefits discussed,  Surgical consent,  Pre-op evaluation,  At surgeon's request and post-op pain management  Laterality: Right  Prep: chloraprep       Needles:  Injection technique: Single-shot  Needle Type: Other     Needle Length: 5cm  Needle Gauge: 22     Additional Needles:   Narrative:  Start time: 07/19/2018 11:40 AM End time: 07/19/2018 11:45 AM Injection made incrementally with aspirations every 5 mL.  Performed by: Personally  Anesthesiologist: Bethena Midget, MD  Additional Notes: Functioning IV was confirmed and monitors were applied. Sterile prep and drape,hand hygiene and sterile gloves were used. Ultrasound guidance: relevant anatomy identified, needle position confirmed,  vascular puncture avoided. Negative aspiration and negative test dose prior to incremental administration of local anesthetic. The patient tolerated the procedure well.

## 2018-07-19 NOTE — Anesthesia Postprocedure Evaluation (Signed)
Anesthesia Post Note  Patient: Nanette S Carder  Procedure(s) Performed: KNEE ARTHROSCOPY WITH MEDIAL MENISECTOMY (Right Knee)     Patient location during evaluation: PACU Anesthesia Type: General Level of consciousness: awake and alert Pain management: pain level controlled Vital Signs Assessment: post-procedure vital signs reviewed and stable Respiratory status: spontaneous breathing, nonlabored ventilation, respiratory function stable and patient connected to nasal cannula oxygen Cardiovascular status: blood pressure returned to baseline and stable Postop Assessment: no apparent nausea or vomiting Anesthetic complications: no    Last Vitals:  Vitals:   07/19/18 1405 07/19/18 1415  BP: (!) 86/43 (!) 91/50  Pulse: 62 67  Resp: 13 12  Temp:    SpO2: 100% 97%    Last Pain:  Vitals:   07/19/18 1405  TempSrc:   PainSc: Asleep                 Peytan Andringa

## 2018-07-19 NOTE — Anesthesia Procedure Notes (Signed)
Procedure Name: LMA Insertion Date/Time: 07/19/2018 1:30 PM Performed by: Burna Cash, CRNA Pre-anesthesia Checklist: Patient identified, Emergency Drugs available, Suction available and Patient being monitored Patient Re-evaluated:Patient Re-evaluated prior to induction Oxygen Delivery Method: Circle system utilized Preoxygenation: Pre-oxygenation with 100% oxygen Induction Type: IV induction Ventilation: Mask ventilation without difficulty LMA: LMA inserted LMA Size: 4.0 Number of attempts: 1 Airway Equipment and Method: Bite block Placement Confirmation: positive ETCO2 Tube secured with: Tape Dental Injury: Teeth and Oropharynx as per pre-operative assessment

## 2018-07-19 NOTE — Interval H&P Note (Signed)
History and Physical Interval Note:  07/19/2018 10:49 AM  Laura Pope  has presented today for surgery, with the diagnosis of RIGHT KNEE MENISCUS TEAR AND  BUCKET HANDLE KNEE.  The various methods of treatment have been discussed with the patient and family. After consideration of risks, benefits and other options for treatment, the patient has consented to  Procedure(s): KNEE ARTHROSCOPY WITH MEDIAL MENISECTOMY (Right) as a surgical intervention.  The patient's history has been reviewed, patient examined, no change in status, stable for surgery.  I have reviewed the patient's chart and labs.  Questions were answered to the patient's satisfaction.     Nilda Simmer

## 2018-07-20 NOTE — Op Note (Signed)
NAME: Laura Pope, VENZOR MEDICAL RECORD ES:[id number] ACCOUNT [id number] DATE OF BIRTH:1971/01/20 FACILITY: [facility] LOCATION: [facility] PHYSICIAN:Admire Bunnell Salley Slaughter, MD  OPERATIVE REPORT  DATE OF PROCEDURE:  07/19/2018  PREOPERATIVE DIAGNOSES:   1.  Right knee acute traumatic medial and lateral meniscal tears. 2.  Right knee chondromalacia.  POSTOPERATIVE DIAGNOSES: 1.  Right knee acute traumatic medial and lateral meniscal tears. 2.  Right knee chondromalacia.  PROCEDURE PERFORMED: 1.  Right knee EUA followed by arthroscopic partial medial and lateral meniscectomies. 2.  Right knee chondroplasty.  SURGEON:  Salvatore Marvel, MD  ASSISTANT:  Julien Girt, PA.  ANESTHESIA:  General.  OPERATIVE TIME:  30 minutes.  COMPLICATIONS:  None.  INDICATIONS:  The patient is a 48 year old woman who sustained a twisting injury to her right knee three weeks ago.  Exam and MRI has revealed a displaced medial meniscus tear and partial lateral meniscus tear with chondromalacia.  She has failed  conservative care and is now to undergo arthroscopy.  DESCRIPTION OF PROCEDURE:  The patient was brought to the operating room on 07/19/2018 after a knee block was placed in the holding room by anesthesia.  She was placed on the operating table in supine position.  After being placed under general  anesthesia, her right knee was examined.  She had full range of motion.  Knee was stable ligamentous exam with normal patellar tracking.  The right leg was prepped and using sterile ChloraPrep and draped using sterile technique.  Time-out procedure was  called and the correct right knee identified.  Initially through an anterolateral portal, the arthroscope with pump attached was placed in through an anteromedial portal, an arthroscopic probe was placed.  On initial inspection, the articular cartilage  in the medial compartment showed 75%, grade III chondromalacia, which was debrided.  Medial meniscus showed a  tear of the posterior horn of which 40% was resected back to a stable rim.  Intercondylar notch was inspected.  Anterior and posterior cruciate  ligaments were normal.  Lateral compartment inspected.  The articular cartilage showed grade I and II chondromalacia.  Lateral meniscus showed a partial tear 20% posterolateral horn which was resected back to a stable rim.  Patellofemoral joint showed  50% grade III chondromalacia, which was debrided.  The patella tracked normally.  Medial and lateral gutters were free of pathology.  At this point, I felt that all pathology had been satisfactorily addressed.  The instruments were removed.  Portals  closed with 3-0 nylon suture.  Sterile dressings were applied and the patient awakened and taken to recovery room in stable condition.   FOLLOWUP CARE:  The patient will be followed as an outpatient on Dilaudid for pain.  She will be seen back in the office in a week for sutures out and followup.  TN/NUANCE  D:07/20/2018 T:07/20/2018 JOB:[id number]

## 2018-07-21 ENCOUNTER — Encounter (HOSPITAL_BASED_OUTPATIENT_CLINIC_OR_DEPARTMENT_OTHER): Payer: Self-pay | Admitting: Orthopedic Surgery

## 2018-07-27 DIAGNOSIS — M545 Low back pain: Secondary | ICD-10-CM | POA: Diagnosis not present

## 2018-07-31 DIAGNOSIS — M4726 Other spondylosis with radiculopathy, lumbar region: Secondary | ICD-10-CM | POA: Diagnosis not present

## 2018-07-31 DIAGNOSIS — M25561 Pain in right knee: Secondary | ICD-10-CM | POA: Diagnosis not present

## 2018-08-11 DIAGNOSIS — M4726 Other spondylosis with radiculopathy, lumbar region: Secondary | ICD-10-CM | POA: Diagnosis not present

## 2018-08-11 DIAGNOSIS — M25561 Pain in right knee: Secondary | ICD-10-CM | POA: Diagnosis not present

## 2018-08-17 DIAGNOSIS — M4726 Other spondylosis with radiculopathy, lumbar region: Secondary | ICD-10-CM | POA: Diagnosis not present

## 2018-08-17 DIAGNOSIS — S83241D Other tear of medial meniscus, current injury, right knee, subsequent encounter: Secondary | ICD-10-CM | POA: Diagnosis not present

## 2018-08-17 DIAGNOSIS — S83281D Other tear of lateral meniscus, current injury, right knee, subsequent encounter: Secondary | ICD-10-CM | POA: Diagnosis not present

## 2018-08-17 DIAGNOSIS — M25561 Pain in right knee: Secondary | ICD-10-CM | POA: Diagnosis not present

## 2018-08-24 DIAGNOSIS — M25561 Pain in right knee: Secondary | ICD-10-CM | POA: Diagnosis not present

## 2018-08-24 DIAGNOSIS — M4726 Other spondylosis with radiculopathy, lumbar region: Secondary | ICD-10-CM | POA: Diagnosis not present

## 2018-09-07 DIAGNOSIS — E669 Obesity, unspecified: Secondary | ICD-10-CM | POA: Diagnosis not present

## 2018-09-07 DIAGNOSIS — I10 Essential (primary) hypertension: Secondary | ICD-10-CM | POA: Diagnosis not present

## 2018-09-07 DIAGNOSIS — E785 Hyperlipidemia, unspecified: Secondary | ICD-10-CM | POA: Diagnosis not present

## 2018-09-07 DIAGNOSIS — Z6830 Body mass index (BMI) 30.0-30.9, adult: Secondary | ICD-10-CM | POA: Diagnosis not present

## 2018-09-08 DIAGNOSIS — M9902 Segmental and somatic dysfunction of thoracic region: Secondary | ICD-10-CM | POA: Diagnosis not present

## 2018-09-08 DIAGNOSIS — M9901 Segmental and somatic dysfunction of cervical region: Secondary | ICD-10-CM | POA: Diagnosis not present

## 2018-09-08 DIAGNOSIS — M5032 Other cervical disc degeneration, mid-cervical region, unspecified level: Secondary | ICD-10-CM | POA: Diagnosis not present

## 2018-09-08 DIAGNOSIS — M9903 Segmental and somatic dysfunction of lumbar region: Secondary | ICD-10-CM | POA: Diagnosis not present

## 2018-10-05 DIAGNOSIS — S83241D Other tear of medial meniscus, current injury, right knee, subsequent encounter: Secondary | ICD-10-CM | POA: Diagnosis not present

## 2018-10-05 DIAGNOSIS — S83281D Other tear of lateral meniscus, current injury, right knee, subsequent encounter: Secondary | ICD-10-CM | POA: Diagnosis not present

## 2018-12-14 DIAGNOSIS — E785 Hyperlipidemia, unspecified: Secondary | ICD-10-CM | POA: Diagnosis not present

## 2018-12-14 DIAGNOSIS — E559 Vitamin D deficiency, unspecified: Secondary | ICD-10-CM | POA: Diagnosis not present

## 2018-12-14 DIAGNOSIS — Z79899 Other long term (current) drug therapy: Secondary | ICD-10-CM | POA: Diagnosis not present

## 2018-12-14 DIAGNOSIS — K2970 Gastritis, unspecified, without bleeding: Secondary | ICD-10-CM | POA: Diagnosis not present

## 2018-12-14 DIAGNOSIS — Z1331 Encounter for screening for depression: Secondary | ICD-10-CM | POA: Diagnosis not present

## 2018-12-14 DIAGNOSIS — Z6832 Body mass index (BMI) 32.0-32.9, adult: Secondary | ICD-10-CM | POA: Diagnosis not present

## 2019-03-19 DIAGNOSIS — Z6832 Body mass index (BMI) 32.0-32.9, adult: Secondary | ICD-10-CM | POA: Diagnosis not present

## 2019-03-19 DIAGNOSIS — E559 Vitamin D deficiency, unspecified: Secondary | ICD-10-CM | POA: Diagnosis not present

## 2019-03-19 DIAGNOSIS — E785 Hyperlipidemia, unspecified: Secondary | ICD-10-CM | POA: Diagnosis not present

## 2019-03-19 DIAGNOSIS — I10 Essential (primary) hypertension: Secondary | ICD-10-CM | POA: Diagnosis not present

## 2019-03-29 ENCOUNTER — Other Ambulatory Visit: Payer: Self-pay | Admitting: Internal Medicine

## 2019-03-29 DIAGNOSIS — Z1231 Encounter for screening mammogram for malignant neoplasm of breast: Secondary | ICD-10-CM

## 2019-05-04 ENCOUNTER — Ambulatory Visit (INDEPENDENT_AMBULATORY_CARE_PROVIDER_SITE_OTHER): Payer: BC Managed Care – PPO

## 2019-05-04 ENCOUNTER — Other Ambulatory Visit: Payer: Self-pay

## 2019-05-04 ENCOUNTER — Encounter: Payer: Self-pay | Admitting: Sports Medicine

## 2019-05-04 ENCOUNTER — Other Ambulatory Visit: Payer: Self-pay | Admitting: Sports Medicine

## 2019-05-04 ENCOUNTER — Ambulatory Visit: Payer: BC Managed Care – PPO | Admitting: Sports Medicine

## 2019-05-04 DIAGNOSIS — M25572 Pain in left ankle and joints of left foot: Secondary | ICD-10-CM | POA: Diagnosis not present

## 2019-05-04 DIAGNOSIS — M79672 Pain in left foot: Secondary | ICD-10-CM

## 2019-05-04 DIAGNOSIS — S96912A Strain of unspecified muscle and tendon at ankle and foot level, left foot, initial encounter: Secondary | ICD-10-CM | POA: Diagnosis not present

## 2019-05-04 DIAGNOSIS — M779 Enthesopathy, unspecified: Secondary | ICD-10-CM

## 2019-05-04 MED ORDER — PREDNISONE 10 MG (21) PO TBPK: ORAL_TABLET | ORAL | 0 refills | Status: DC

## 2019-05-04 NOTE — Progress Notes (Signed)
Subjective: Deneene S Brenes is a 49 y.o. female patient who presents to office for evaluation of Left heel pain. Patient complains of progressive pain especially over the last 5 months since September after stepping in a hole reports the pain is sharp 5-10 out of 10 and feels some popping with walking and standing.  Patient reports that around 2:00 in the afternoon the pain tends to get very bad and hurts her until she gets off her foot and goes to bed reports that the pain is worse with being on the foot some gentle stretching helps and has tried changing it Tylenol cold compresses but reports that there is a line of stiffness and swelling that makes it difficult for her to walk.  Patient denies any other types of injury or trauma since September. Patient denies any other pedal complaints.   Review of Systems  All other systems reviewed and are negative.    Patient Active Problem List   Diagnosis Date Noted  . Acute medial meniscus tear of right knee 07/17/2018  . Hypertension   . Asthma   . Influenza with respiratory manifestations 05/24/2013  . Acute bronchitis 05/23/2013  . Metabolic acidosis 16/60/6301  . Fever, unspecified 05/23/2013  . Sinus tachycardia 05/23/2013  . Dysuria 05/23/2013    Current Outpatient Medications on File Prior to Visit  Medication Sig Dispense Refill  . acetaminophen (TYLENOL) 500 MG tablet Take 1,000 mg by mouth every 4 (four) hours as needed for headache.    . albuterol (ACCUNEB) 1.25 MG/3ML nebulizer solution Take 2 ampules by nebulization every 6 (six) hours as needed for wheezing.    Marland Kitchen albuterol (PROVENTIL HFA;VENTOLIN HFA) 108 (90 BASE) MCG/ACT inhaler Inhale 2 puffs into the lungs every 4 (four) hours as needed for wheezing or shortness of breath.    . guaiFENesin-dextromethorphan (ROBITUSSIN DM) 100-10 MG/5ML syrup Take 5 mLs by mouth every 4 (four) hours as needed for cough. 118 mL 0  . HYDROmorphone (DILAUDID) 2 MG tablet Take 1 tablet (2 mg total)  by mouth every 4 (four) hours as needed for severe pain. 15 tablet 0  . lisinopril-hydrochlorothiazide (PRINZIDE,ZESTORETIC) 10-12.5 MG per tablet Take 1 tablet by mouth daily. DO NOT RESUME TILL SEEN BY PCP.    Marland Kitchen montelukast (SINGULAIR) 10 MG tablet Take 10 mg by mouth at bedtime.    . rosuvastatin (CRESTOR) 40 MG tablet Take 40 mg by mouth daily.     No current facility-administered medications on file prior to visit.    Allergies  Allergen Reactions  . Lidocaine Itching    Usually requires benadryl at the dentist if they have to numb her mouth. She gets a rash and itching.  . Aspirin Swelling    Throat swelling  . Mushroom Extract Complex Swelling  . Tartrazine Swelling  . Contrast Media [Iodinated Diagnostic Agents] Itching and Rash  . Ibuprofen Rash  . Oxycodone Rash    Developed an itchy rash 24 hours after taking Percocet. She had no problems with her airway.  . Penicillins Swelling and Rash  . Vicodin [Hydrocodone-Acetaminophen] Swelling and Rash    Objective:  General: Alert and oriented x3 in no acute distress  Dermatology: No open lesions bilateral lower extremities, no webspace macerations, no ecchymosis bilateral, all nails x 10 are well manicured.  Vascular: Dorsalis Pedis and Posterior Tibial pedal pulses 1/4, Capillary Fill Time 3 seconds, + pedal hair growth bilateral, no edema bilateral lower extremities, Temperature gradient within normal limits.  Neurology: Gross sensation intact via  light touch bilateral.  Musculoskeletal: Mild to moderate tenderness with palpation at insertion of the Achilles, peroneal tendon course, and insertion of the plantar fascia on left, there is calcaneal exostosis with mild soft tissue present and decreased ankle rom with knee extending  vs flexed resembling gastroc equnius bilateral, The achilles tendon feels intact with no nodularity or palpable dell, Thompson sign negative, Subtalar and midtarsal joint range of motion is within  normal limits, there is no 1st ray hypermobility or forefoot deformity noted bilateral.   Gait: Antalgic gait with increased heel off left.  Xrays  Left foot   Impression: Normal osseous mineralization. Joint spaces preserved.  There is a small avulsion fleck of bone noted at the calcaneus, calcaneal spur present. Kager's triangle intact with no obliteration. No soft tissue abnormalities or radiopaque foreign bodies.   Assessment and Plan: Problem List Items Addressed This Visit    None    Visit Diagnoses    Tear of tendon of left ankle, initial encounter    -  Primary   Left ankle pain, unspecified chronicity       Capsulitis       Tendonitis          -Complete examination performed -Xrays reviewed -Discussed treatment options for possible partial tendon tear versus severe tendinitis after injury in September 2020 -Dispensed cam boot for patient to wear at all times when attempting to ambulate -Ordered MRI for further evaluation to rule out tear -Rx prednisone Dosepak to help with pain inflammation -Dispensed surgitube compression sleeve to assist with edema control -Advised patient to avoid strenuous activity or overstretching that include exacerbate her symptoms -Patient to return to office after MRI or sooner if condition worsens.  Asencion Islam, DPM

## 2019-05-07 ENCOUNTER — Telehealth: Payer: Self-pay | Admitting: *Deleted

## 2019-05-07 NOTE — Addendum Note (Signed)
Addended by: Hadley Pen R on: 05/07/2019 11:11 AM   Modules accepted: Orders

## 2019-05-07 NOTE — Telephone Encounter (Signed)
Put the order in for the MRI L ankle with contrast and sent a message for Ivonne. Misty Stanley

## 2019-05-07 NOTE — Telephone Encounter (Signed)
-----   Message from [place], [place] sent at 05/04/2019 10:47 AM EST ----- Regarding: MRI L ankle R/o Tear at achilles and evaluate plantar heel for plantar fascia and peroneal involvement Injury stepped in a hole in September

## 2019-05-09 ENCOUNTER — Telehealth: Payer: Self-pay

## 2019-05-09 ENCOUNTER — Other Ambulatory Visit: Payer: Self-pay

## 2019-05-09 DIAGNOSIS — M779 Enthesopathy, unspecified: Secondary | ICD-10-CM

## 2019-05-09 DIAGNOSIS — I159 Secondary hypertension, unspecified: Secondary | ICD-10-CM

## 2019-05-09 NOTE — Progress Notes (Signed)
BUN

## 2019-05-09 NOTE — Telephone Encounter (Signed)
Authorization not required for Lt ankle MRI w/o contrast  Scheduled MRI  Appt with [facility] for Feb-26-2021 at 1:30PM Pt to be there at 1:15PM. [facility] stated Pt needs an order for BUN and creatinine, order was faxed.  LVM to Pt stating about her MRI appt and blood work that needs to be done at [facility] outpt at 12:30

## 2019-05-10 ENCOUNTER — Other Ambulatory Visit: Payer: Self-pay

## 2019-05-10 ENCOUNTER — Ambulatory Visit
Admission: RE | Admit: 2019-05-10 | Discharge: 2019-05-10 | Disposition: A | Discharge status: Home / Self Care | Payer: BC Managed Care – PPO | Source: Ambulatory Visit | Attending: Internal Medicine | Admitting: Internal Medicine

## 2019-05-10 DIAGNOSIS — Z1231 Encounter for screening mammogram for malignant neoplasm of breast: Secondary | ICD-10-CM | POA: Diagnosis not present

## 2019-05-10 DIAGNOSIS — M779 Enthesopathy, unspecified: Secondary | ICD-10-CM

## 2019-05-10 DIAGNOSIS — S96912A Strain of unspecified muscle and tendon at ankle and foot level, left foot, initial encounter: Secondary | ICD-10-CM

## 2019-05-10 DIAGNOSIS — S82899A Other fracture of unspecified lower leg, initial encounter for closed fracture: Secondary | ICD-10-CM

## 2019-05-10 DIAGNOSIS — M25572 Pain in left ankle and joints of left foot: Secondary | ICD-10-CM

## 2019-05-11 ENCOUNTER — Encounter: Payer: Self-pay | Admitting: Sports Medicine

## 2019-05-11 DIAGNOSIS — M25472 Effusion, left ankle: Secondary | ICD-10-CM | POA: Diagnosis not present

## 2019-05-11 DIAGNOSIS — S96912A Strain of unspecified muscle and tendon at ankle and foot level, left foot, initial encounter: Secondary | ICD-10-CM | POA: Diagnosis not present

## 2019-05-14 ENCOUNTER — Other Ambulatory Visit: Payer: Self-pay | Admitting: Sports Medicine

## 2019-05-14 DIAGNOSIS — S96912A Strain of unspecified muscle and tendon at ankle and foot level, left foot, initial encounter: Secondary | ICD-10-CM

## 2019-05-16 ENCOUNTER — Telehealth (INDEPENDENT_AMBULATORY_CARE_PROVIDER_SITE_OTHER): Payer: BC Managed Care – PPO | Admitting: Sports Medicine

## 2019-05-16 ENCOUNTER — Other Ambulatory Visit: Payer: Self-pay

## 2019-05-16 DIAGNOSIS — M79672 Pain in left foot: Secondary | ICD-10-CM

## 2019-05-16 DIAGNOSIS — M25572 Pain in left ankle and joints of left foot: Secondary | ICD-10-CM

## 2019-05-16 DIAGNOSIS — M779 Enthesopathy, unspecified: Secondary | ICD-10-CM

## 2019-05-16 DIAGNOSIS — M6740 Ganglion, unspecified site: Secondary | ICD-10-CM

## 2019-05-16 NOTE — Progress Notes (Signed)
Virtual Visit via Telephone Note  I connected with Laura Pope on 05/16/19 at  4:30 PM EST by telephone and verified that I am speaking with the correct person using two identifiers.  Location: Patient: Laura Pope  Provider: Asencion Islam, DPM   I discussed the limitations, risks, security and privacy concerns of performing an evaluation and management service by telephone and the availability of in person appointments. I also discussed with the patient that there may be a patient responsible charge related to this service. The patient expressed understanding and agreed to proceed.   History of Present Illness: 49 y/o female patient with history of left foot/ankle pain and had MRI done is having a virtual visit over the phone to discuss the results   Observations/Objective: Patient reports that her pain is a little better with the boot and she is noticing a difference with the boot, reports that she finished the steroid and has been resting, icing, and elevating as well. Patient denies any increase in redness, warmth, swelling, and pain on left.  My observations were not able to be performed due to the telephone nature of this visit   Assessment and Plan: MRI Reveals achilles tendonitis and small ganglion cyst at sinus tarsi Achilles tendonitis  Ganglion Left foot/ankle pain  Follow Up Instructions: Continue with CAM boot for 2 more weeks then may transition from boot to shoe with ankle brace Recommend continue with rest, ice, elevation Advised patient if pain worsens when she tries to go without boot to return to office for possible injection to the area   I discussed the assessment and treatment plan with the patient. The patient was provided an opportunity to ask questions and all were answered. The patient agreed with the plan and demonstrated an understanding of the instructions.   The patient was advised to call back or seek an in-person evaluation if the symptoms  worsen or if the condition fails to improve as anticipated.  I provided 15 minutes of non-face-to-face time during this encounter.   Asencion Islam, DPM

## 2019-06-20 DIAGNOSIS — E785 Hyperlipidemia, unspecified: Secondary | ICD-10-CM | POA: Diagnosis not present

## 2019-06-20 DIAGNOSIS — Z6832 Body mass index (BMI) 32.0-32.9, adult: Secondary | ICD-10-CM | POA: Diagnosis not present

## 2019-06-20 DIAGNOSIS — E559 Vitamin D deficiency, unspecified: Secondary | ICD-10-CM | POA: Diagnosis not present

## 2019-06-20 DIAGNOSIS — I10 Essential (primary) hypertension: Secondary | ICD-10-CM | POA: Diagnosis not present

## 2019-06-29 DIAGNOSIS — E785 Hyperlipidemia, unspecified: Secondary | ICD-10-CM | POA: Diagnosis not present

## 2019-06-29 DIAGNOSIS — E559 Vitamin D deficiency, unspecified: Secondary | ICD-10-CM | POA: Diagnosis not present

## 2019-06-29 DIAGNOSIS — Z79899 Other long term (current) drug therapy: Secondary | ICD-10-CM | POA: Diagnosis not present

## 2019-09-19 DIAGNOSIS — I10 Essential (primary) hypertension: Secondary | ICD-10-CM | POA: Diagnosis not present

## 2019-09-19 DIAGNOSIS — J309 Allergic rhinitis, unspecified: Secondary | ICD-10-CM | POA: Diagnosis not present

## 2019-09-19 DIAGNOSIS — E559 Vitamin D deficiency, unspecified: Secondary | ICD-10-CM | POA: Diagnosis not present

## 2019-09-19 DIAGNOSIS — E785 Hyperlipidemia, unspecified: Secondary | ICD-10-CM | POA: Diagnosis not present

## 2019-10-11 DIAGNOSIS — M129 Arthropathy, unspecified: Secondary | ICD-10-CM | POA: Diagnosis not present

## 2019-10-11 DIAGNOSIS — J45909 Unspecified asthma, uncomplicated: Secondary | ICD-10-CM | POA: Diagnosis not present

## 2019-10-11 DIAGNOSIS — M7752 Other enthesopathy of left foot: Secondary | ICD-10-CM | POA: Diagnosis not present

## 2019-10-11 DIAGNOSIS — Z6832 Body mass index (BMI) 32.0-32.9, adult: Secondary | ICD-10-CM | POA: Diagnosis not present

## 2020-02-01 IMAGING — MG DIGITAL SCREENING BILATERAL MAMMOGRAM WITH TOMO AND CAD
8 series · 8 of 24 positions shown · non-contrast
Comparison: Previous exam(s).

CLINICAL DATA: Screening.

EXAM:
DIGITAL SCREENING BILATERAL MAMMOGRAM WITH TOMO AND CAD

[L CC synth-2D]
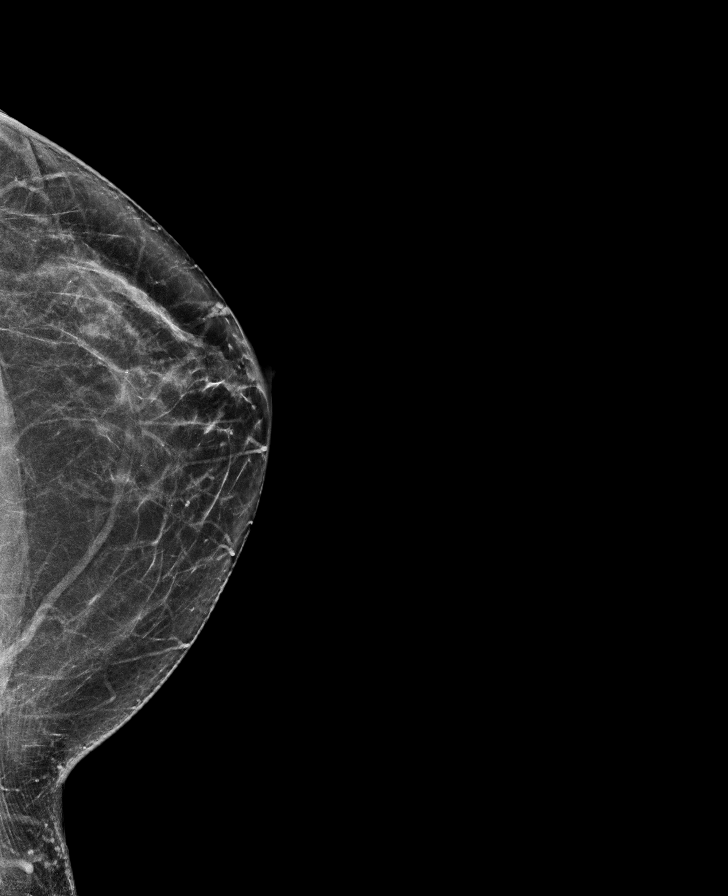

[L MLO synth-2D]
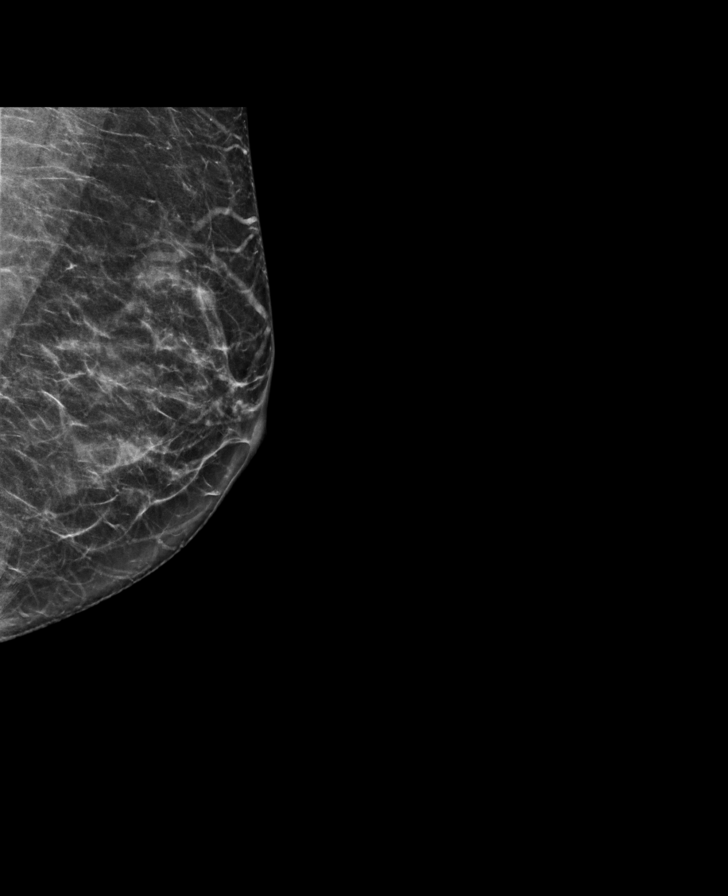

[R MLO synth-2D]
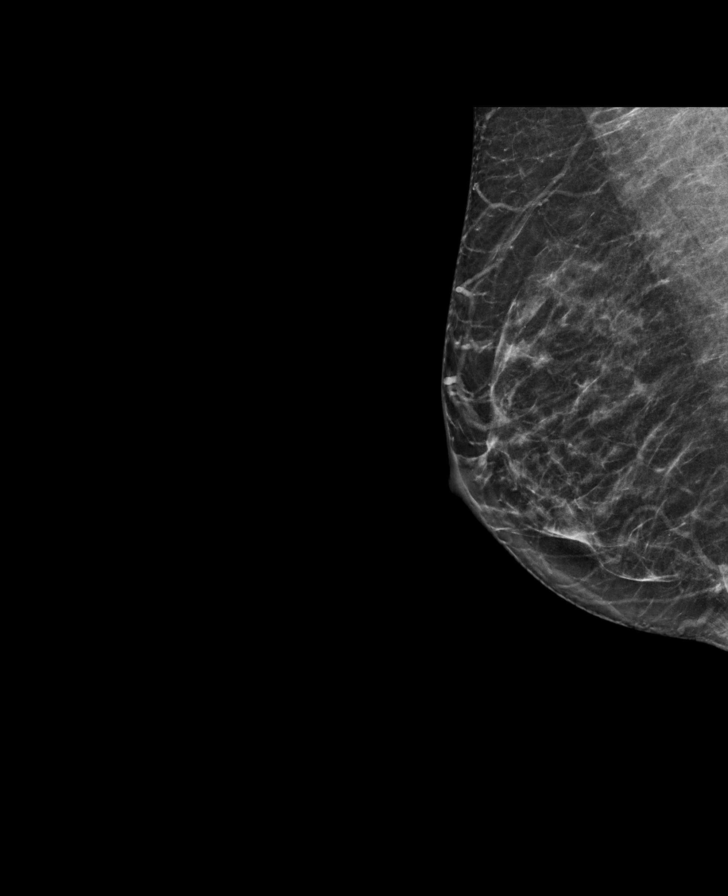

[R CC synth-2D]
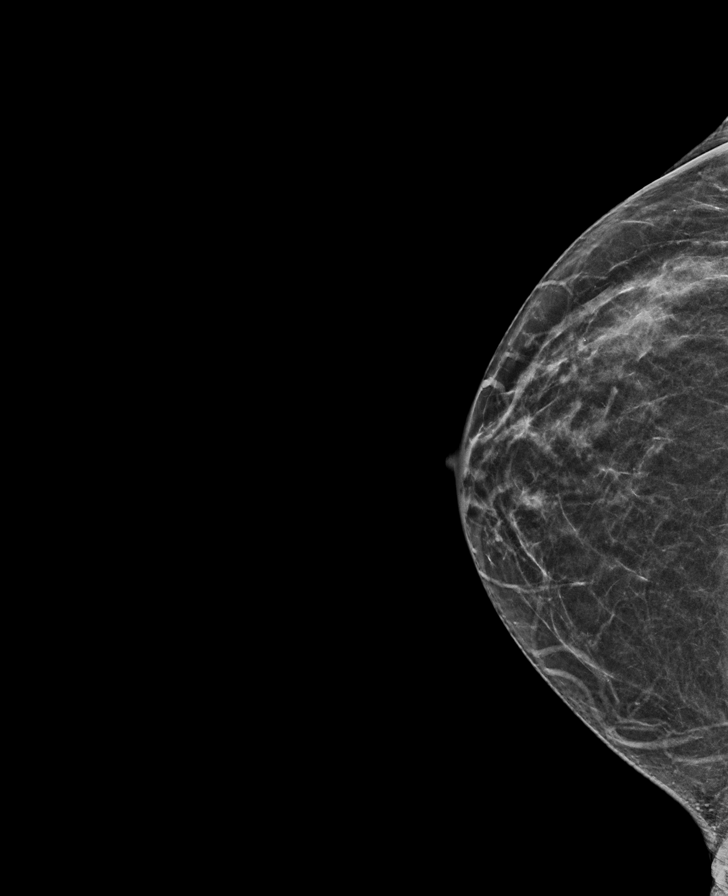

[R MLO tomo · tomo slice 29/58.0]
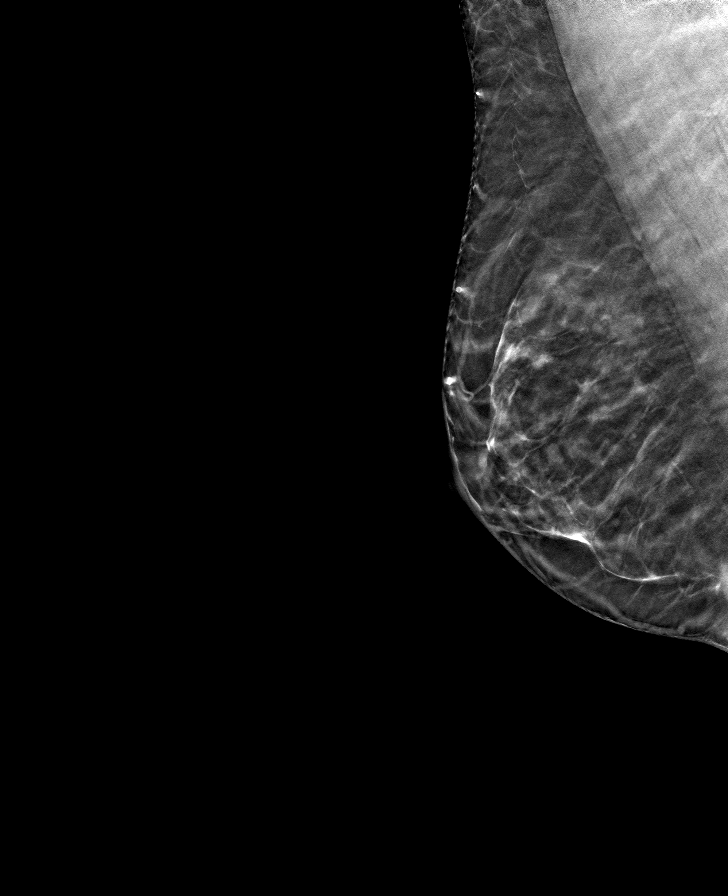

[R CC tomo · tomo slice 28/55.0]
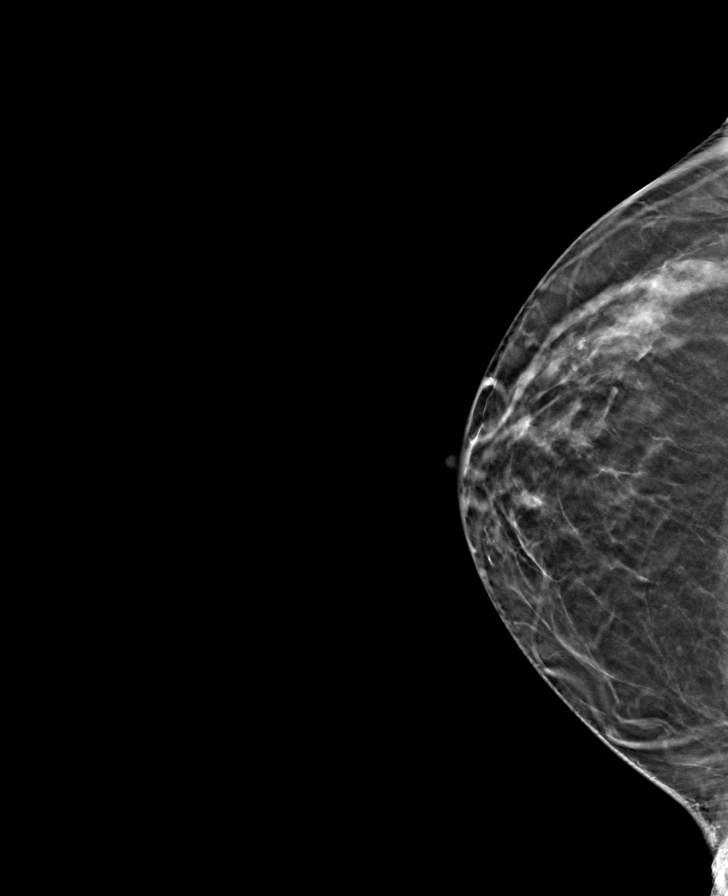

[L MLO tomo · tomo slice 31/62.0]
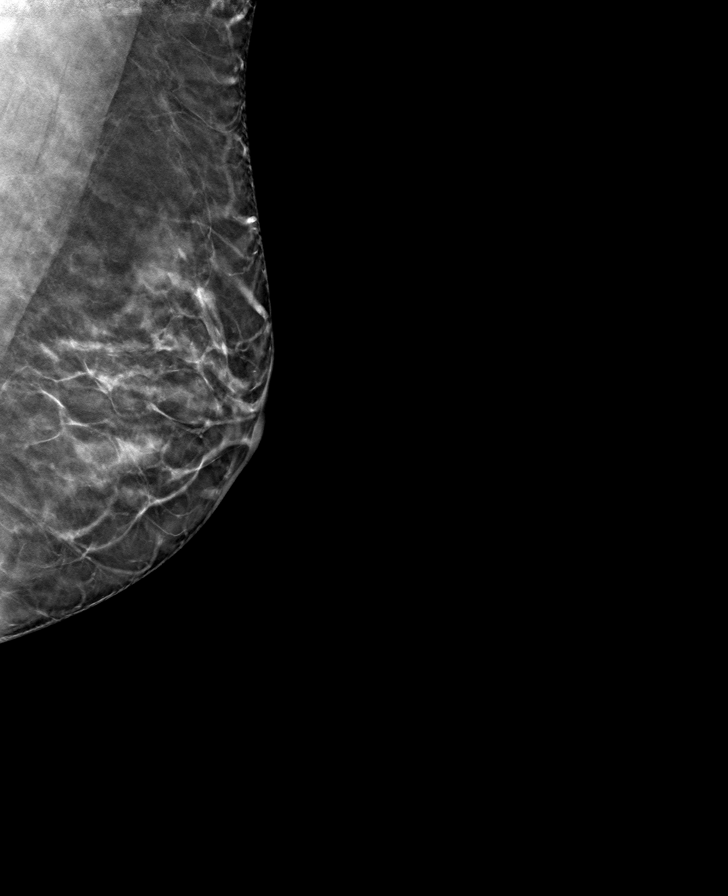

[L CC tomo · tomo slice 35/68.0]
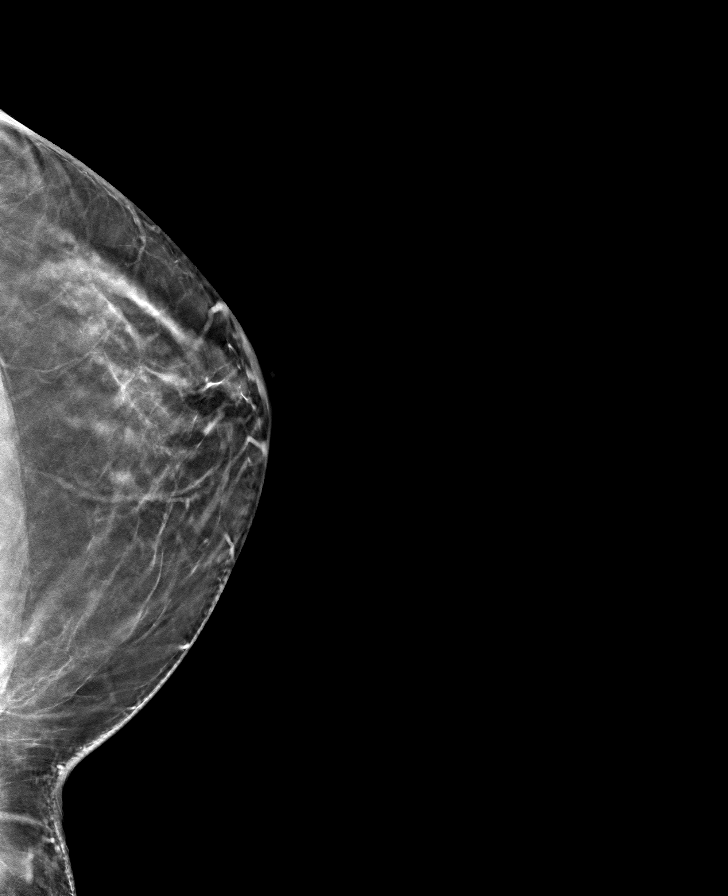

[8 of 24 positions shown; findings below may reference images not displayed]

ACR Breast Density Category c: The breast tissue is heterogeneously
dense, which may obscure small masses.
FINDINGS: There are no findings suspicious for malignancy. Images were
processed with CAD.
IMPRESSION: No mammographic evidence of malignancy. A result letter of this
screening mammogram will be mailed directly to the patient.

RECOMMENDATION:
Screening mammogram in one year. (Code:FT-U-LHB)

BI-RADS CATEGORY  1: Negative.

## 2020-04-03 DIAGNOSIS — Z1331 Encounter for screening for depression: Secondary | ICD-10-CM | POA: Diagnosis not present

## 2020-04-03 DIAGNOSIS — Z23 Encounter for immunization: Secondary | ICD-10-CM | POA: Diagnosis not present

## 2020-04-03 DIAGNOSIS — Z0000 Encounter for general adult medical examination without abnormal findings: Secondary | ICD-10-CM | POA: Diagnosis not present

## 2020-04-09 ENCOUNTER — Other Ambulatory Visit: Payer: Self-pay | Admitting: Internal Medicine

## 2020-04-09 DIAGNOSIS — Z1231 Encounter for screening mammogram for malignant neoplasm of breast: Secondary | ICD-10-CM

## 2020-07-03 DIAGNOSIS — E669 Obesity, unspecified: Secondary | ICD-10-CM | POA: Diagnosis not present

## 2020-07-03 DIAGNOSIS — Z6831 Body mass index (BMI) 31.0-31.9, adult: Secondary | ICD-10-CM | POA: Diagnosis not present

## 2020-07-03 DIAGNOSIS — J209 Acute bronchitis, unspecified: Secondary | ICD-10-CM | POA: Diagnosis not present

## 2020-07-03 DIAGNOSIS — J069 Acute upper respiratory infection, unspecified: Secondary | ICD-10-CM | POA: Diagnosis not present

## 2020-07-17 ENCOUNTER — Ambulatory Visit
Admission: RE | Admit: 2020-07-17 | Discharge: 2020-07-17 | Disposition: A | Discharge status: Home / Self Care | Payer: BC Managed Care – PPO | Source: Ambulatory Visit | Attending: Internal Medicine | Admitting: Internal Medicine

## 2020-07-17 ENCOUNTER — Other Ambulatory Visit: Payer: Self-pay

## 2020-07-17 DIAGNOSIS — Z1231 Encounter for screening mammogram for malignant neoplasm of breast: Secondary | ICD-10-CM

## 2020-08-01 DIAGNOSIS — E559 Vitamin D deficiency, unspecified: Secondary | ICD-10-CM | POA: Diagnosis not present

## 2020-08-01 DIAGNOSIS — J309 Allergic rhinitis, unspecified: Secondary | ICD-10-CM | POA: Diagnosis not present

## 2020-08-01 DIAGNOSIS — I10 Essential (primary) hypertension: Secondary | ICD-10-CM | POA: Diagnosis not present

## 2020-08-01 DIAGNOSIS — E785 Hyperlipidemia, unspecified: Secondary | ICD-10-CM | POA: Diagnosis not present

## 2020-08-02 DIAGNOSIS — H43812 Vitreous degeneration, left eye: Secondary | ICD-10-CM | POA: Diagnosis not present

## 2020-10-10 DIAGNOSIS — H43392 Other vitreous opacities, left eye: Secondary | ICD-10-CM | POA: Diagnosis not present

## 2020-10-10 DIAGNOSIS — H43812 Vitreous degeneration, left eye: Secondary | ICD-10-CM | POA: Diagnosis not present

## 2020-10-10 DIAGNOSIS — H1045 Other chronic allergic conjunctivitis: Secondary | ICD-10-CM | POA: Diagnosis not present

## 2020-10-10 DIAGNOSIS — H33322 Round hole, left eye: Secondary | ICD-10-CM | POA: Diagnosis not present

## 2020-10-10 DIAGNOSIS — H33312 Horseshoe tear of retina without detachment, left eye: Secondary | ICD-10-CM | POA: Diagnosis not present

## 2020-10-17 DIAGNOSIS — H33312 Horseshoe tear of retina without detachment, left eye: Secondary | ICD-10-CM | POA: Diagnosis not present

## 2020-10-17 DIAGNOSIS — H31092 Other chorioretinal scars, left eye: Secondary | ICD-10-CM | POA: Diagnosis not present

## 2020-10-17 DIAGNOSIS — H33322 Round hole, left eye: Secondary | ICD-10-CM | POA: Diagnosis not present

## 2020-11-28 DIAGNOSIS — H33322 Round hole, left eye: Secondary | ICD-10-CM | POA: Diagnosis not present

## 2020-11-28 DIAGNOSIS — H31092 Other chorioretinal scars, left eye: Secondary | ICD-10-CM | POA: Diagnosis not present

## 2020-11-28 DIAGNOSIS — H33312 Horseshoe tear of retina without detachment, left eye: Secondary | ICD-10-CM | POA: Diagnosis not present

## 2020-11-28 DIAGNOSIS — H43812 Vitreous degeneration, left eye: Secondary | ICD-10-CM | POA: Diagnosis not present

## 2021-01-22 DIAGNOSIS — J309 Allergic rhinitis, unspecified: Secondary | ICD-10-CM | POA: Diagnosis not present

## 2021-01-22 DIAGNOSIS — I10 Essential (primary) hypertension: Secondary | ICD-10-CM | POA: Diagnosis not present

## 2021-01-22 DIAGNOSIS — E785 Hyperlipidemia, unspecified: Secondary | ICD-10-CM | POA: Diagnosis not present

## 2021-01-22 DIAGNOSIS — E559 Vitamin D deficiency, unspecified: Secondary | ICD-10-CM | POA: Diagnosis not present

## 2021-03-27 DIAGNOSIS — H2513 Age-related nuclear cataract, bilateral: Secondary | ICD-10-CM | POA: Diagnosis not present

## 2021-03-27 DIAGNOSIS — H43812 Vitreous degeneration, left eye: Secondary | ICD-10-CM | POA: Diagnosis not present

## 2021-03-27 DIAGNOSIS — H31092 Other chorioretinal scars, left eye: Secondary | ICD-10-CM | POA: Diagnosis not present

## 2021-05-14 DIAGNOSIS — Z0000 Encounter for general adult medical examination without abnormal findings: Secondary | ICD-10-CM | POA: Diagnosis not present

## 2021-05-14 DIAGNOSIS — Z6832 Body mass index (BMI) 32.0-32.9, adult: Secondary | ICD-10-CM | POA: Diagnosis not present

## 2021-05-29 ENCOUNTER — Other Ambulatory Visit: Payer: Self-pay | Admitting: Internal Medicine

## 2021-05-29 DIAGNOSIS — N6452 Nipple discharge: Secondary | ICD-10-CM

## 2021-06-12 ENCOUNTER — Other Ambulatory Visit: Payer: Self-pay | Admitting: Internal Medicine

## 2021-06-12 DIAGNOSIS — Z1231 Encounter for screening mammogram for malignant neoplasm of breast: Secondary | ICD-10-CM

## 2021-06-26 ENCOUNTER — Other Ambulatory Visit: Payer: BC Managed Care – PPO

## 2021-07-03 ENCOUNTER — Ambulatory Visit (INDEPENDENT_AMBULATORY_CARE_PROVIDER_SITE_OTHER): Payer: BC Managed Care – PPO

## 2021-07-03 ENCOUNTER — Other Ambulatory Visit: Payer: Self-pay | Admitting: *Deleted

## 2021-07-03 ENCOUNTER — Ambulatory Visit: Payer: BC Managed Care – PPO | Admitting: Sports Medicine

## 2021-07-03 ENCOUNTER — Encounter: Payer: Self-pay | Admitting: Sports Medicine

## 2021-07-03 DIAGNOSIS — M7752 Other enthesopathy of left foot: Secondary | ICD-10-CM | POA: Diagnosis not present

## 2021-07-03 DIAGNOSIS — M79672 Pain in left foot: Secondary | ICD-10-CM | POA: Diagnosis not present

## 2021-07-03 DIAGNOSIS — M7750 Other enthesopathy of unspecified foot: Secondary | ICD-10-CM

## 2021-07-03 DIAGNOSIS — M7662 Achilles tendinitis, left leg: Secondary | ICD-10-CM

## 2021-07-03 DIAGNOSIS — M722 Plantar fascial fibromatosis: Secondary | ICD-10-CM

## 2021-07-03 MED ORDER — METHYLPREDNISOLONE 4 MG PO TBPK: ORAL_TABLET | ORAL | 0 refills | Status: AC

## 2021-07-03 MED ORDER — MELOXICAM 7.5 MG PO TABS: 7.5000 mg | ORAL_TABLET | Freq: Every day | ORAL | 0 refills | Status: DC

## 2021-07-03 MED ORDER — DIPHENHYDRAMINE HCL 25 MG PO TABS: 25.0000 mg | ORAL_TABLET | Freq: Four times a day (QID) | ORAL | 0 refills | Status: AC | PRN

## 2021-07-03 MED ORDER — TRIAMCINOLONE ACETONIDE 10 MG/ML IJ SUSP
10.0000 mg | Freq: Once | INTRAMUSCULAR | Status: AC
  Administered 2021-07-03: 10 mg

## 2021-07-03 NOTE — Progress Notes (Signed)
Subjective: ?Alizey MINDI AKERSON is a 51 y.o. female patient presents to office with complaint of moderate heel pain on the left. Patient admits to post static dyskinesia for 1 month in duration. Patient has treated this problem with icing twice a daily with no relief. Denies any other pedal complaints.  ? ?Patient Active Problem List  ? Diagnosis Date Noted  ? Acute medial meniscus tear of right knee 07/17/2018  ? Hypertension   ? Asthma   ? Influenza with respiratory manifestations 05/24/2013  ? Acute bronchitis 05/23/2013  ? Metabolic acidosis 05/23/2013  ? Fever, unspecified 05/23/2013  ? Sinus tachycardia 05/23/2013  ? Dysuria 05/23/2013  ? ? ?Current Outpatient Medications on File Prior to Visit  ?Medication Sig Dispense Refill  ? acetaminophen (TYLENOL) 500 MG tablet Take 1,000 mg by mouth every 4 (four) hours as needed for headache.    ? albuterol (PROVENTIL HFA;VENTOLIN HFA) 108 (90 BASE) MCG/ACT inhaler Inhale 2 puffs into the lungs every 4 (four) hours as needed for wheezing or shortness of breath.    ? guaiFENesin-dextromethorphan (ROBITUSSIN DM) 100-10 MG/5ML syrup Take 5 mLs by mouth every 4 (four) hours as needed for cough. 118 mL 0  ? lisinopril-hydrochlorothiazide (PRINZIDE,ZESTORETIC) 10-12.5 MG per tablet Take 1 tablet by mouth daily. DO NOT RESUME TILL SEEN BY PCP.    ? montelukast (SINGULAIR) 10 MG tablet Take 10 mg by mouth at bedtime.    ? pantoprazole (PROTONIX) 40 MG tablet Take 40 mg by mouth daily.    ? rosuvastatin (CRESTOR) 40 MG tablet Take 40 mg by mouth daily.    ? Vitamin D, Ergocalciferol, (DRISDOL) 1.25 MG (50000 UNIT) CAPS capsule     ? ?No current facility-administered medications on file prior to visit.  ? ? ?Allergies  ?Allergen Reactions  ? Lidocaine Itching  ?  Usually requires benadryl at the dentist if they have to numb her mouth. She gets a rash and itching.  ? Aspirin Swelling  ?  Throat swelling  ? Mushroom Extract Complex Swelling  ? Tartrazine Swelling  ? Contrast Media  [Iodinated Contrast Media] Itching and Rash  ? Ibuprofen Rash  ? Oxycodone Rash  ?  Developed an itchy rash 24 hours after taking Percocet. She had no problems with her airway.  ? Penicillins Swelling and Rash  ? Vicodin [Hydrocodone-Acetaminophen] Swelling and Rash  ? ? ?Objective: ?Physical Exam ?General: The patient is alert and oriented x3 in no acute distress. ? ?Dermatology: Skin is warm, dry and supple bilateral lower extremities. Nails 1-10 are normal. There is no erythema, edema, no eccymosis, no open lesions present. Integument is otherwise unremarkable. ? ?Vascular: Dorsalis Pedis pulse and Posterior Tibial pulse are 2/4 bilateral. Capillary fill time is immediate to all digits. ? ?Neurological: Grossly intact to light touch .  ? ?Musculoskeletal: Tenderness to palpation at the medial calcaneal tubercale and through the insertion of the plantar fascia on the left foot.  Pain to achilles at watershed area on left and at insertion. Tendon intact no gap or dell. No pain with calf compression bilateral. There is decreased Ankle joint range of motion on left. All other joints range of motion within normal limits bilateral. Strength 5/5 in all groups bilateral.  ? ?Gait: Unassisted, Antalgic avoid weight on left heel ? ?Xray,Left foot:  ?Normal osseous mineralization. Joint spaces preserved. No fracture/dislocation/boney destruction. Minimal Calcaneal spur present with mild thickening of plantar fascia. No other soft tissue abnormalities or radiopaque foreign bodies.  ? ?Assessment and Plan: ?Problem List  Items Addressed This Visit   ?None ?Visit Diagnoses   ? ? Plantar fasciitis, left    -  Primary  ? Tendonitis, Achilles, left      ? Inflammatory pain of left heel      ? ?  ? ? ?-Complete examination performed.  ?-Xrays reviewed ?-Discussed with patient in detail the condition of plantar fasciitis and acute flare of achilles tendonitis (MRI in 2021)  how this occurs and general treatment options. Explained  both conservative and surgical treatments.  ?-After oral consent and aseptic prep, injected a mixture containing 1 ml of 2%  ?plain lidocaine, 1 ml 0.5% plain marcaine, 0.5 ml of kenalog 10 and 0.5 ml of dexamethasone phosphate into left heel at glabous junction at  plantar fascia. Post-injection care discussed with patient.  ?-Rx Meloxicam to start after Medrol dose pack is completed. Bendryl rx for history of swelling and allergy of which patient states she can take as long as she has benadryl ?-Recommended return to using CAM boot for 1 week and then after transition to tennis shoes with heel lifts as dispensed ?-Recommend patient to ice affected area 1-2x daily. ?-Patient to return to office in 3 weeks for follow up or sooner if problems or questions arise. Advised patient in future would benefit from orthotics.  ? ?Asencion Islam, DPM ? ? ?

## 2021-07-23 ENCOUNTER — Ambulatory Visit
Admission: RE | Admit: 2021-07-23 | Discharge: 2021-07-23 | Disposition: A | Discharge status: Home / Self Care | Payer: BC Managed Care – PPO | Source: Ambulatory Visit | Attending: Internal Medicine | Admitting: Internal Medicine

## 2021-07-23 DIAGNOSIS — Z1231 Encounter for screening mammogram for malignant neoplasm of breast: Secondary | ICD-10-CM

## 2021-07-24 ENCOUNTER — Encounter: Payer: Self-pay | Admitting: Sports Medicine

## 2021-07-24 ENCOUNTER — Ambulatory Visit: Payer: BC Managed Care – PPO | Admitting: Sports Medicine

## 2021-07-24 DIAGNOSIS — M722 Plantar fascial fibromatosis: Secondary | ICD-10-CM

## 2021-07-24 DIAGNOSIS — M7662 Achilles tendinitis, left leg: Secondary | ICD-10-CM

## 2021-07-24 DIAGNOSIS — M7750 Other enthesopathy of unspecified foot: Secondary | ICD-10-CM

## 2021-07-24 DIAGNOSIS — M79672 Pain in left foot: Secondary | ICD-10-CM | POA: Diagnosis not present

## 2021-07-24 DIAGNOSIS — S86012S Strain of left Achilles tendon, sequela: Secondary | ICD-10-CM

## 2021-07-24 NOTE — Progress Notes (Signed)
Subjective: ?Laura Pope is a 51 y.o. female patient returns to office with complaint of moderate heel pain on the left. Patient reports that injection helped for 3 days on bottom, less pain there but still bad pain that makes her cry or have to still use the boot at the back of the left heel. Mobic and medrol helped swelling.  Denies any other pedal complaints.  ? ?Patient Active Problem List  ? Diagnosis Date Noted  ? Acute medial meniscus tear of right knee 07/17/2018  ? Hypertension   ? Asthma   ? Influenza with respiratory manifestations 05/24/2013  ? Acute bronchitis 05/23/2013  ? Metabolic acidosis [id number]  ? Fever, unspecified 05/23/2013  ? Sinus tachycardia 05/23/2013  ? Dysuria 05/23/2013  ? ? ?Current Outpatient Medications on File Prior to Visit  ?Medication Sig Dispense Refill  ? acetaminophen (TYLENOL) 500 MG tablet Take 1,000 mg by mouth every 4 (four) hours as needed for headache.    ? albuterol (PROVENTIL HFA;VENTOLIN HFA) 108 (90 BASE) MCG/ACT inhaler Inhale 2 puffs into the lungs every 4 (four) hours as needed for wheezing or shortness of breath.    ? diphenhydrAMINE (BENADRYL ALLERGY ULTRATABS) 25 MG tablet Take 1 tablet (25 mg total) by mouth every 6 (six) hours as needed. 30 tablet 0  ? guaiFENesin-dextromethorphan (ROBITUSSIN DM) 100-10 MG/5ML syrup Take 5 mLs by mouth every 4 (four) hours as needed for cough. 118 mL 0  ? lisinopril-hydrochlorothiazide (PRINZIDE,ZESTORETIC) 10-12.5 MG per tablet Take 1 tablet by mouth daily. DO NOT RESUME TILL SEEN BY PCP.    ? meloxicam (MOBIC) 7.5 MG tablet Take 1 tablet (7.5 mg total) by mouth daily. 30 tablet 0  ? methylPREDNISolone (MEDROL DOSEPAK) 4 MG TBPK tablet Take as directed 21 tablet 0  ? montelukast (SINGULAIR) 10 MG tablet Take 10 mg by mouth at bedtime.    ? pantoprazole (PROTONIX) 40 MG tablet Take 40 mg by mouth daily.    ? rosuvastatin (CRESTOR) 40 MG tablet Take 40 mg by mouth daily.    ? Vitamin D, Ergocalciferol, (DRISDOL) 1.25 MG  (50000 UNIT) CAPS capsule     ? ?No current facility-administered medications on file prior to visit.  ? ? ?Allergies  ?Allergen Reactions  ? Lidocaine Itching  ?  Usually requires benadryl at the dentist if they have to numb her mouth. She gets a rash and itching.  ? Aspirin Swelling  ?  Throat swelling  ? Mushroom Extract Complex Swelling  ? Tartrazine Swelling  ? Contrast Media [Iodinated Contrast Media] Itching and Rash  ? Ibuprofen Rash  ? Oxycodone Rash  ?  Developed an itchy rash 24 hours after taking Percocet. She had no problems with her airway.  ? Penicillins Swelling and Rash  ? Vicodin [Hydrocodone-Acetaminophen] Swelling and Rash  ? ? ?Objective: ?Physical Exam ?General: The patient is alert and oriented x3 in no acute distress. ? ?Dermatology: Skin is warm, dry and supple bilateral lower extremities. Nails 1-10 are normal. There is no erythema, edema, no eccymosis, no open lesions present. Integument is otherwise unremarkable. ? ?Vascular: Dorsalis Pedis pulse and Posterior Tibial pulse are 2/4 bilateral. Capillary fill time is immediate to all digits. ? ?Neurological: Grossly intact to light touch .  ? ?Musculoskeletal: Decreased tenderness to palpation at the medial calcaneal tubercale and through the insertion of the plantar fascia on the left foot.  Continued pain to achilles at watershed area on left and at insertion. Tendon intact no gap or dell. No pain  with calf compression bilateral. There is decreased Ankle joint range of motion on left. All other joints range of motion within normal limits bilateral. Strength 5/5 in all groups bilateral.  ? ?Assessment and Plan: ?Problem List Items Addressed This Visit   ?None ?Visit Diagnoses   ? ? Plantar fasciitis, left    -  Primary  ? Tendonitis, Achilles, left      ? Tendonitis of ankle      ? Relevant Orders  ? MR ANKLE LEFT WO CONTRAST  ? Inflammatory pain of left heel      ? Relevant Orders  ? MR ANKLE LEFT WO CONTRAST  ? Achilles tendon tear, left,  sequela      ? Relevant Orders  ? MR ANKLE LEFT WO CONTRAST  ? ?  ? ? ?-Complete examination performed.  ?-Discussed continue pain in the left heel ?-Advised patient that because her pain is flared up really bad in the PM we will order MRI to r/o tear ?-Dispensed Power steps since OTC insoles have helped and advised patient if pain worsens go back into boot full time ?-Dispensed night splint to support the achilles at bedtime and gentle stretching and recommend patient use in the evening with icing as directed ?-Continue with Mobic until completed ?-May continue with home tens unit ?-Continue with limited activities to tolerance ?-Return after MRI or sooner if questions arise. Advised patient in future would benefit from orthotics.  ? ?Landis Martins, DPM ? ? ?

## 2021-08-03 ENCOUNTER — Telehealth: Payer: Self-pay | Admitting: *Deleted

## 2021-08-03 NOTE — Telephone Encounter (Signed)
Called and spoke with Carita from [facility] on 07-29-2021 and the procedure code [id number] needs a  peer to peer with the doctor and Dr Marylene Land spoke with the clinical provider and the authorization number is [id number] and is valid 07-29-21 to 10-26-2021 and called patient and the facility to get the MRI scheduled. Misty Stanley

## 2021-08-06 DIAGNOSIS — M7662 Achilles tendinitis, left leg: Secondary | ICD-10-CM | POA: Diagnosis not present

## 2021-08-06 DIAGNOSIS — S86012A Strain of left Achilles tendon, initial encounter: Secondary | ICD-10-CM | POA: Diagnosis not present

## 2021-08-06 DIAGNOSIS — R600 Localized edema: Secondary | ICD-10-CM | POA: Diagnosis not present

## 2021-08-13 ENCOUNTER — Telehealth: Payer: Self-pay | Admitting: Sports Medicine

## 2021-08-13 MED ORDER — MELOXICAM 7.5 MG PO TABS: 7.5000 mg | ORAL_TABLET | Freq: Every day | ORAL | 0 refills | Status: AC

## 2021-08-13 NOTE — Telephone Encounter (Signed)
Called patient and discussed MRI results. Left ankle. Negative for tear at [employer]. Advised patient to continue with home PT. Night splint, gentle stretching, icing and tens unit. Refill mobic in case of a flare and advised patient to avoid strenous activities for the next 3 months until better. Advised patient to call office for a follow up appointment if things fail to continue to improve.  -Dr. Marylene Land

## 2021-08-26 DIAGNOSIS — E559 Vitamin D deficiency, unspecified: Secondary | ICD-10-CM | POA: Diagnosis not present

## 2021-08-26 DIAGNOSIS — I10 Essential (primary) hypertension: Secondary | ICD-10-CM | POA: Diagnosis not present

## 2021-08-26 DIAGNOSIS — J309 Allergic rhinitis, unspecified: Secondary | ICD-10-CM | POA: Diagnosis not present

## 2021-08-26 DIAGNOSIS — E785 Hyperlipidemia, unspecified: Secondary | ICD-10-CM | POA: Diagnosis not present

## 2021-11-18 DIAGNOSIS — M25572 Pain in left ankle and joints of left foot: Secondary | ICD-10-CM | POA: Diagnosis not present

## 2021-12-22 ENCOUNTER — Emergency Department (HOSPITAL_COMMUNITY)
Admission: EM | Admit: 2021-12-22 | Discharge: 2021-12-23 | Disposition: A | Discharge status: Home / Self Care | Payer: BC Managed Care – PPO | Attending: Emergency Medicine | Admitting: Emergency Medicine

## 2021-12-22 ENCOUNTER — Encounter (HOSPITAL_COMMUNITY): Payer: Self-pay

## 2021-12-22 ENCOUNTER — Emergency Department (HOSPITAL_COMMUNITY): Payer: BC Managed Care – PPO

## 2021-12-22 ENCOUNTER — Other Ambulatory Visit: Payer: Self-pay

## 2021-12-22 DIAGNOSIS — M25572 Pain in left ankle and joints of left foot: Secondary | ICD-10-CM | POA: Diagnosis not present

## 2021-12-22 DIAGNOSIS — X500XXA Overexertion from strenuous movement or load, initial encounter: Secondary | ICD-10-CM | POA: Insufficient documentation

## 2021-12-22 DIAGNOSIS — Y9301 Activity, walking, marching and hiking: Secondary | ICD-10-CM | POA: Insufficient documentation

## 2021-12-22 DIAGNOSIS — S86012A Strain of left Achilles tendon, initial encounter: Secondary | ICD-10-CM | POA: Insufficient documentation

## 2021-12-22 DIAGNOSIS — R7309 Other abnormal glucose: Secondary | ICD-10-CM | POA: Insufficient documentation

## 2021-12-22 DIAGNOSIS — S99912A Unspecified injury of left ankle, initial encounter: Secondary | ICD-10-CM | POA: Diagnosis not present

## 2021-12-22 DIAGNOSIS — R600 Localized edema: Secondary | ICD-10-CM | POA: Diagnosis not present

## 2021-12-22 LAB — CBC WITH DIFFERENTIAL/PLATELET
Abs Immature Granulocytes: 0.01 10*3/uL (ref 0.00–0.07)
Basophils Absolute: 0.1 10*3/uL (ref 0.0–0.1)
Basophils Relative: 1 %
Eosinophils Absolute: 0.2 10*3/uL (ref 0.0–0.5)
Eosinophils Relative: 2 %
HCT: 43.6 % (ref 36.0–46.0)
Hemoglobin: 14.6 g/dL (ref 12.0–15.0)
Immature Granulocytes: 0 %
Lymphocytes Relative: 36 %
Lymphs Abs: 2.4 10*3/uL (ref 0.7–4.0)
MCH: 32.3 pg (ref 26.0–34.0)
MCHC: 33.5 g/dL (ref 30.0–36.0)
MCV: 96.5 fL (ref 80.0–100.0)
Monocytes Absolute: 0.6 10*3/uL (ref 0.1–1.0)
Monocytes Relative: 8 %
Neutro Abs: 3.5 10*3/uL (ref 1.7–7.7)
Neutrophils Relative %: 53 %
Platelets: 219 10*3/uL (ref 150–400)
RBC: 4.52 MIL/uL (ref 3.87–5.11)
RDW: 11.7 % (ref 11.5–15.5)
WBC: 6.6 10*3/uL (ref 4.0–10.5)
nRBC: 0 % (ref 0.0–0.2)

## 2021-12-22 LAB — BASIC METABOLIC PANEL
Anion gap: 10 (ref 5–15)
BUN: 13 mg/dL (ref 6–20)
CO2: 23 mmol/L (ref 22–32)
Calcium: 9.1 mg/dL (ref 8.9–10.3)
Chloride: 106 mmol/L (ref 98–111)
Creatinine, Ser: 0.83 mg/dL (ref 0.44–1.00)
GFR, Estimated: >60 mL/min (ref >60)
Glucose, Bld: 100 mg/dL — ABNORMAL HIGH (ref 70–99)
Potassium: 4 mmol/L (ref 3.5–5.1)
Sodium: 139 mmol/L (ref 135–145)

## 2021-12-22 LAB — I-STAT BETA HCG BLOOD, ED (MC, WL, AP ONLY): I-stat hCG, quantitative: <5 m[IU]/mL (ref <5)

## 2021-12-22 NOTE — ED Provider Triage Note (Signed)
Emergency Medicine Provider Triage Evaluation Note  Laura Pope , a 51 y.o. female  was evaluated in triage.  Pt complains of left ankle pain.  Patient states she was walking and felt a snap in the back of her ankle.  No previous Achilles tendon injuries.  No recent antibiotics.  Patient unable to plantarflex or dorsiflex foot.   Review of Systems  Positive: Arthralgia  Negative: fever  Physical Exam  BP 123/85 (BP Location: Right Arm)   Pulse (!) 104   Temp 99.4 F (37.4 C) (Oral)   Resp 18   Ht 5\' 6"  (1.676 m)   Wt 89.8 kg   SpO2 98%   BMI 31.96 kg/m  Gen:   Awake, no distress   Resp:  Normal effort  MSK:   Moves extremities without difficulty  Other:  Pedal pulses palpable  Medical Decision Making  Medically screening exam initiated at 6:55 PM.  Appropriate orders placed.  Laura Pope was informed that the remainder of the evaluation will be completed by another provider, this initial triage assessment does not replace that evaluation, and the importance of remaining in the ED until their evaluation is complete.  Possible achilles tendon rupture? X-ray ordered to rule out bony fractures labs   Laura Pope 12/22/21 1857

## 2021-12-22 NOTE — ED Notes (Signed)
To room after mri

## 2021-12-22 NOTE — ED Triage Notes (Signed)
Patient went to step and felt a snap in the back of her ankle (achilles tendon) and then she feel.  + foot drop to left foot.

## 2021-12-23 MED ORDER — FENTANYL CITRATE PF 50 MCG/ML IJ SOSY
50.0000 ug | PREFILLED_SYRINGE | Freq: Once | INTRAMUSCULAR | Status: AC
  Administered 2021-12-23: 50 ug via INTRAMUSCULAR
  Filled 2021-12-23: qty 1

## 2021-12-23 MED ORDER — ONDANSETRON HCL 4 MG PO TABS: 4.0000 mg | ORAL_TABLET | Freq: Four times a day (QID) | ORAL | 0 refills | Status: AC

## 2021-12-23 MED ORDER — BACLOFEN 5 MG PO TABS: 5.0000 mg | ORAL_TABLET | Freq: Two times a day (BID) | ORAL | 0 refills | Status: AC | PRN

## 2021-12-23 NOTE — ED Provider Notes (Addendum)
[facility] EMERGENCY DEPARTMENT Provider Note   CSN: [id number] Arrival date & time: 12/22/21  9470     History  Chief Complaint  Patient presents with   Ankle Pain    Laura Pope is a 51 y.o. female.  HPI   Patient  without significant medical history presents with complaints of left ankle/heel pain.  Patient states that today while she was walking she planted her toes on the ground and heard a pop in the back of her ankle, states she has significant pain in that area, states she has tenderness when moving her toes and is unable to move her ankle due to pain, she states that she is never had this in the past, she denies any paresthesias moving down her foot no weakness she has no other complaints.    Home Medications Prior to Admission medications   Medication Sig Start Date End Date Taking? Authorizing Provider  baclofen 5 MG TABS Take 5 mg by mouth 2 (two) times daily as needed for up to 7 days for muscle spasms. 12/23/21 12/30/21 Yes Marcello Fennel, PA-C  ondansetron (ZOFRAN) 4 MG tablet Take 1 tablet (4 mg total) by mouth every 6 (six) hours. 12/23/21  Yes Marcello Fennel, PA-C  acetaminophen (TYLENOL) 500 MG tablet Take 1,000 mg by mouth every 4 (four) hours as needed for headache.    [provider]  albuterol (PROVENTIL HFA;VENTOLIN HFA) 108 (90 BASE) MCG/ACT inhaler Inhale 2 puffs into the lungs every 4 (four) hours as needed for wheezing or shortness of breath.    [provider]  diphenhydrAMINE (BENADRYL ALLERGY ULTRATABS) 25 MG tablet Take 1 tablet (25 mg total) by mouth every 6 (six) hours as needed. 07/03/21   Stover, Titorya, DPM  guaiFENesin-dextromethorphan (ROBITUSSIN DM) 100-10 MG/5ML syrup Take 5 mLs by mouth every 4 (four) hours as needed for cough. 05/25/13   Bonnielee Haff, MD  lisinopril-hydrochlorothiazide (PRINZIDE,ZESTORETIC) 10-12.5 MG per tablet Take 1 tablet by mouth daily. DO NOT RESUME TILL SEEN BY  PCP. 05/25/13   Bonnielee Haff, MD  meloxicam (MOBIC) 7.5 MG tablet Take 1 tablet (7.5 mg total) by mouth daily. 08/13/21   Landis Martins, DPM  methylPREDNISolone (MEDROL DOSEPAK) 4 MG TBPK tablet Take as directed 07/03/21   Landis Martins, DPM  montelukast (SINGULAIR) 10 MG tablet Take 10 mg by mouth at bedtime.    [provider]  pantoprazole (PROTONIX) 40 MG tablet Take 40 mg by mouth daily. 04/23/19   [provider]  rosuvastatin (CRESTOR) 40 MG tablet Take 40 mg by mouth daily.    Joline Salt, RN  Vitamin D, Ergocalciferol, (DRISDOL) 1.25 MG (50000 UNIT) CAPS capsule  04/23/19   [provider]      Allergies    Lidocaine, Aspirin, Mushroom extract complex, Tartrazine, Contrast media [iodinated contrast media], Ibuprofen, Oxycodone, Penicillins, and Vicodin [hydrocodone-acetaminophen]    Review of Systems   Review of Systems  Constitutional:  Negative for chills and fever.  Respiratory:  Negative for shortness of breath.   Cardiovascular:  Negative for chest pain.  Gastrointestinal:  Negative for abdominal pain.  Musculoskeletal:        Left ankle pain  Neurological:  Negative for headaches.    Physical Exam Updated Vital Signs BP 109/73   Pulse 76   Temp 98.1 F (36.7 C) (Oral)   Resp 16   Ht 5\' 6"  (1.676 m)   Wt 89.8 kg   SpO2 98%   BMI  31.96 kg/m  Physical Exam Vitals and nursing note reviewed.  Constitutional:      General: She is not in acute distress.    Appearance: Normal appearance. She is not ill-appearing or diaphoretic.  HENT:     Head: Normocephalic and atraumatic.     Nose: No congestion or rhinorrhea.  Musculoskeletal:     Cervical back: Neck supple.     Right lower leg: No edema.     Left lower leg: No edema.     Comments: Focused exam of the left leg reveals some slight edema around the heel and the insertion site of the Achilles tendon, there is no palpable defect at the area, she did have some calf tenderness but she  had a negative Thompson sign, she is able to move her toes and has minimal flexion and extension at the ankle due to pain.  All compartments are soft neurovascular fully intact.  Skin:    General: Skin is warm and dry.  Neurological:     Mental Status: She is alert and oriented to person, place, and time.  Psychiatric:        Mood and Affect: Mood normal.     ED Results / Procedures / Treatments   Labs (all labs ordered are listed, but only abnormal results are displayed) Labs Reviewed  BASIC METABOLIC PANEL - Abnormal; Notable for the following components:      Result Value   Glucose, Bld 100 (*)    All other components within normal limits  CBC WITH DIFFERENTIAL/PLATELET  I-STAT BETA HCG BLOOD, ED ([facility], [facility], [facility] ONLY)    EKG None  Radiology MR ANKLE LEFT WO CONTRAST  Result Date: 12/23/2021 CLINICAL DATA:  Left ankle pain. Achilles tendon trauma or laceration. EXAM: MRI OF THE LEFT ANKLE WITHOUT CONTRAST TECHNIQUE: Multiplanar, multisequence MR imaging of the ankle was performed. No intravenous contrast was administered. COMPARISON:  08/06/2021 FINDINGS: Left ankle: TENDONS Peroneal: Intact peroneus longus and peroneus brevis tendons. Posteromedial: Intact tibialis posterior, flexor hallucis longus and flexor digitorum longus tendons. Anterior: Intact tibialis anterior, extensor hallucis longus and extensor digitorum longus tendons. Achilles: Near complete tear of the Achilles tendon with single strand of tendon remaining attached to the calcaneus. 3.4 cm musculotendinous retraction of the torn segment. Fluid in the tendon sheath. Plantar Fascia: Intact. LIGAMENTS Lateral: Intact. Medial: Intact. CARTILAGE Ankle Joint: No joint effusion or chondral defect. Subtalar Joints/Sinus Tarsi: No joint effusion or chondral defect. Bones: No marrow signal abnormality. No fracture or dislocation. Other: Diffuse subcutaneous edema predominantly in the posterior subcutaneous soft tissues. IMPRESSION:  Nearly complete Achilles tendon tear with musculotendinous retraction of the majority of the tendon and associated soft tissue edema and fluid in the tendon sheath. Electronically Signed   By: Lucienne Capers M.D.   On: 12/23/2021 00:18   DG Ankle Complete Left  Result Date: 12/22/2021 CLINICAL DATA:  Fall, left ankle injury EXAM: LEFT ANKLE COMPLETE - 3+ VIEW COMPARISON:  None Available. FINDINGS: Normal alignment. No acute fracture or dislocation. Ankle mortise appears aligned. Corticated density subjacent to the medial malleolus may represent an accessory ossicle or sequela of remote inflammation/trauma. No ankle effusion. There is infiltration of Kager's fat and the shadow of the Achilles tendon is poorly visualized. Together, these findings may represent changes related to disruption of the Achilles tendon. Soft tissues are otherwise unremarkable. IMPRESSION: 1. No acute fracture or dislocation. 2. Possible Achilles tendon disruption. This could be further assessed with MRI examination if indicated. Electronically Signed  By: Fidela Salisbury M.D.   On: 12/22/2021 19:42    Procedures .Splint Application  Date/Time: 12/23/2021 2:59 AM  Performed by: Marcello Fennel, PA-C Authorized by: Marcello Fennel, PA-C   Consent:    Consent obtained:  Verbal   Consent given by:  Patient   Risks, benefits, and alternatives were discussed: yes     Risks discussed:  Discoloration, numbness, pain and swelling   Alternatives discussed:  No treatment Pre-procedure details:    Distal neurologic exam:  Normal   Distal perfusion: distal pulses strong   Procedure details:    Location:  Leg   Leg location:  L lower leg   Strapping: no     Splint type:  Short leg Post-procedure details:    Distal neurologic exam:  Normal   Distal perfusion: distal pulses strong     Procedure completion:  Tolerated well, no immediate complications   Post-procedure imaging: not applicable       Medications  Ordered in ED Medications  fentaNYL (SUBLIMAZE) injection 50 mcg (50 mcg Intramuscular Given 12/23/21 0104)    ED Course/ Medical Decision Making/ A&P                           Medical Decision Making Risk Prescription drug management.   This patient presents to the ED for concern of left ankle pain, this involves an extensive number of treatment options, and is a complaint that carries with it a high risk of complications and morbidity.  The differential diagnosis includes fracture, dislocation, tendon damage    Additional history obtained:  Additional history obtained from husband External records from outside source obtained and reviewed including orthopedic notes   Co morbidities that complicate the patient evaluation  N/a  Social Determinants of Health:  N/a    Lab Tests:  I Ordered, and personally interpreted labs.  The pertinent results include: CBC unremarkable BMP shows glucose of 100, hCG negative   Imaging Studies ordered:  I ordered imaging studies including left ankle x-ray, MRI ankle I independently visualized and interpreted imaging which showed x-ray shows no acute fractures or dislocation possible Achilles tendon rupture, MRI shows nearly complete Achilles tendon tear I agree with the radiologist interpretation   Cardiac Monitoring:  The patient was maintained on a cardiac monitor.  I personally viewed and interpreted the cardiac monitored which showed an underlying rhythm of: N/A   Medicines ordered and prescription drug management:  I ordered medication including fentanyl I have reviewed the patients home medicines and have made adjustments as needed  Critical Interventions:  N/A   Reevaluation:  Presents with ankle pain triage obtain lab work imaging which I personally reviewed, MRI is consistent with Achilles tendon tear, will consult with orthopedics  Updated patient on recommendations they are agreement this plan  Reassessed the  patient after the splint was performed compartments soft neurovascularly intact agreement with discharge at this time  Consultations Obtained:  I requested consultation with the Spoke with Dr. Zachery Dakins ,  and discussed lab and imaging findings as well as pertinent plan - they recommend: Recommends posterior splint with plantarflexion nonweightbearing crutches follow-up with him for further evaluation    Test Considered:  N/a    Rule out I have low suspicion for septic arthritis as patient denies IV drug use, skin exam was performed no erythematous, edematous, warm joints noted on exam, no new heart murmur heard on exam.  Low suspicion for fracture or  dislocation as x-ray does not feel any significant findings. Low suspicion for compartment syndrome as area was palpated it was soft to the touch, neurovascular fully intact.     Dispostion and problem list  After consideration of the diagnostic results and the patients response to treatment, I feel that the patent would benefit from discharge.  Achilles tendon rupture-please send a posterior splint, given crutches, follow-up with Ortho for further evaluation strict return precautions.            Final Clinical Impression(s) / ED Diagnoses Final diagnoses:  Rupture of left Achilles tendon, initial encounter    Rx / DC Orders ED Discharge Orders          Ordered    baclofen 5 MG TABS  2 times daily PRN        12/23/21 0256    ondansetron (ZOFRAN) 4 MG tablet  Every 6 hours        12/23/21 0256              Marcello Fennel, PA-C 12/23/21 0258    Marcello Fennel, PA-C 12/23/21 0259    Quintella Reichert, MD 12/23/21 [phone]

## 2021-12-23 NOTE — Discharge Instructions (Signed)
You have ruptured the tendon in your heel, I placed in a splint please leave on do not get wet, please remain nonweightbearing use the crutches that were provided to you.  Keep it elevated will not use may apply ice to the area, use Tylenol every 6 as needed for pain.  Follow-up with orthopedics for further evaluation  Come back to the emergency department if you develop chest pain, shortness of breath, severe abdominal pain, uncontrolled nausea, vomiting, diarrhea.

## 2021-12-23 NOTE — Progress Notes (Signed)
Orthopedic Tech Progress Note Patient Details:  Laura Pope 05-26-1970 [id number]  Ortho Devices Type of Ortho Device: Post (short) splint, Crutches Splint Material: Fiberglass Ortho Device/Splint Location: LLE Ortho Device/Splint Interventions: Ordered, Application, Adjustment   Post Interventions Patient Tolerated: Well Instructions Provided: Adjustment of device, Care of device, Poper ambulation with device  Adrieana Fennelly L Randall Colden 12/23/2021, 2:47 AM

## 2021-12-24 DIAGNOSIS — S86012A Strain of left Achilles tendon, initial encounter: Secondary | ICD-10-CM | POA: Diagnosis not present

## 2021-12-25 DIAGNOSIS — S86012A Strain of left Achilles tendon, initial encounter: Secondary | ICD-10-CM | POA: Diagnosis not present

## 2021-12-30 DIAGNOSIS — J309 Allergic rhinitis, unspecified: Secondary | ICD-10-CM | POA: Diagnosis not present

## 2021-12-30 DIAGNOSIS — E559 Vitamin D deficiency, unspecified: Secondary | ICD-10-CM | POA: Diagnosis not present

## 2021-12-30 DIAGNOSIS — I10 Essential (primary) hypertension: Secondary | ICD-10-CM | POA: Diagnosis not present

## 2021-12-30 DIAGNOSIS — E785 Hyperlipidemia, unspecified: Secondary | ICD-10-CM | POA: Diagnosis not present

## 2021-12-31 DIAGNOSIS — G8918 Other acute postprocedural pain: Secondary | ICD-10-CM | POA: Diagnosis not present

## 2021-12-31 DIAGNOSIS — M7732 Calcaneal spur, left foot: Secondary | ICD-10-CM | POA: Diagnosis not present

## 2021-12-31 DIAGNOSIS — M958 Other specified acquired deformities of musculoskeletal system: Secondary | ICD-10-CM | POA: Diagnosis not present

## 2021-12-31 DIAGNOSIS — S86012A Strain of left Achilles tendon, initial encounter: Secondary | ICD-10-CM | POA: Diagnosis not present

## 2021-12-31 DIAGNOSIS — S86092A Other specified injury of left Achilles tendon, initial encounter: Secondary | ICD-10-CM | POA: Diagnosis not present

## 2021-12-31 DIAGNOSIS — M7662 Achilles tendinitis, left leg: Secondary | ICD-10-CM | POA: Diagnosis not present

## 2022-01-22 DIAGNOSIS — M25561 Pain in right knee: Secondary | ICD-10-CM | POA: Diagnosis not present

## 2022-02-08 DIAGNOSIS — M7662 Achilles tendinitis, left leg: Secondary | ICD-10-CM | POA: Diagnosis not present

## 2022-05-07 DIAGNOSIS — M7662 Achilles tendinitis, left leg: Secondary | ICD-10-CM | POA: Diagnosis not present

## 2022-05-24 DIAGNOSIS — M9901 Segmental and somatic dysfunction of cervical region: Secondary | ICD-10-CM | POA: Diagnosis not present

## 2022-05-24 DIAGNOSIS — M25572 Pain in left ankle and joints of left foot: Secondary | ICD-10-CM | POA: Diagnosis not present

## 2022-05-24 DIAGNOSIS — M9903 Segmental and somatic dysfunction of lumbar region: Secondary | ICD-10-CM | POA: Diagnosis not present

## 2022-05-24 DIAGNOSIS — M9902 Segmental and somatic dysfunction of thoracic region: Secondary | ICD-10-CM | POA: Diagnosis not present

## 2022-05-24 DIAGNOSIS — M9905 Segmental and somatic dysfunction of pelvic region: Secondary | ICD-10-CM | POA: Diagnosis not present

## 2022-05-24 DIAGNOSIS — M5451 Vertebrogenic low back pain: Secondary | ICD-10-CM | POA: Diagnosis not present

## 2022-06-28 DIAGNOSIS — M9904 Segmental and somatic dysfunction of sacral region: Secondary | ICD-10-CM | POA: Diagnosis not present

## 2022-06-28 DIAGNOSIS — M47892 Other spondylosis, cervical region: Secondary | ICD-10-CM | POA: Diagnosis not present

## 2022-06-28 DIAGNOSIS — M9903 Segmental and somatic dysfunction of lumbar region: Secondary | ICD-10-CM | POA: Diagnosis not present

## 2022-06-28 DIAGNOSIS — M9905 Segmental and somatic dysfunction of pelvic region: Secondary | ICD-10-CM | POA: Diagnosis not present

## 2022-06-28 DIAGNOSIS — M5136 Other intervertebral disc degeneration, lumbar region: Secondary | ICD-10-CM | POA: Diagnosis not present

## 2022-06-28 DIAGNOSIS — M9902 Segmental and somatic dysfunction of thoracic region: Secondary | ICD-10-CM | POA: Diagnosis not present

## 2022-06-28 DIAGNOSIS — M47894 Other spondylosis, thoracic region: Secondary | ICD-10-CM | POA: Diagnosis not present

## 2022-06-28 DIAGNOSIS — M9901 Segmental and somatic dysfunction of cervical region: Secondary | ICD-10-CM | POA: Diagnosis not present

## 2022-07-02 DIAGNOSIS — M7662 Achilles tendinitis, left leg: Secondary | ICD-10-CM | POA: Diagnosis not present

## 2022-08-27 DIAGNOSIS — M25572 Pain in left ankle and joints of left foot: Secondary | ICD-10-CM | POA: Diagnosis not present

## 2022-09-02 DIAGNOSIS — E559 Vitamin D deficiency, unspecified: Secondary | ICD-10-CM | POA: Diagnosis not present

## 2022-09-02 DIAGNOSIS — Z79899 Other long term (current) drug therapy: Secondary | ICD-10-CM | POA: Diagnosis not present

## 2022-09-02 DIAGNOSIS — Z0000 Encounter for general adult medical examination without abnormal findings: Secondary | ICD-10-CM | POA: Diagnosis not present

## 2022-09-10 ENCOUNTER — Telehealth: Payer: Self-pay

## 2022-09-10 NOTE — Patient Outreach (Signed)
  Care Coordination   Initial Visit Note   09/10/2022 Name: Laura Pope MRN: [id number] DOB: [birth year]  Laura Pope is a 52 y.o. year old female who sees Lucianne Lei, MD for primary care. I spoke with  Leonides Grills by phone today.  What matters to the patients health and wellness today?  Placed call to patient to review and offer [facility] care coordination program.  Patient reports she recently changed insurance from [employer] to [employer]. Reports she was getting mail order pharmacy and is not sure how she will get her RX now.  Also reviewed that she is having hot flashes with menopause.     SDOH assessments and interventions completed:  No     Care Coordination Interventions:  Yes, provided    Interventions Today    Flowsheet Row Most Recent Value  Chronic Disease   Chronic disease during today's visit Other  [pharmacy concerns and menopause.]  Education Interventions   Education Provided Provided Education  [Encouraged patient to call member services with [facility] to inquire about mail order pharmacy or local pharmacy options. Encouraged patient to then call MD office and update her pharmacy.  Reviewed with patient to discuss hot flashes with MD at next visit.]       Follow up plan: No further intervention required.   Encounter Outcome:  Pt. Visit Completed   Rowe Pavy, RN, BSN, CEN [facility] Coordinator [phone]

## 2022-09-21 DIAGNOSIS — Z1212 Encounter for screening for malignant neoplasm of rectum: Secondary | ICD-10-CM | POA: Diagnosis not present

## 2022-09-21 DIAGNOSIS — Z1211 Encounter for screening for malignant neoplasm of colon: Secondary | ICD-10-CM | POA: Diagnosis not present

## 2022-09-24 DIAGNOSIS — M25572 Pain in left ankle and joints of left foot: Secondary | ICD-10-CM | POA: Diagnosis not present

## 2022-09-28 LAB — COLOGUARD: COLOGUARD: NEGATIVE

## 2022-12-23 DIAGNOSIS — N951 Menopausal and female climacteric states: Secondary | ICD-10-CM | POA: Diagnosis not present

## 2022-12-23 DIAGNOSIS — J309 Allergic rhinitis, unspecified: Secondary | ICD-10-CM | POA: Diagnosis not present

## 2022-12-23 DIAGNOSIS — J45909 Unspecified asthma, uncomplicated: Secondary | ICD-10-CM | POA: Diagnosis not present

## 2022-12-23 DIAGNOSIS — M545 Low back pain, unspecified: Secondary | ICD-10-CM | POA: Diagnosis not present

## 2022-12-23 DIAGNOSIS — N3281 Overactive bladder: Secondary | ICD-10-CM | POA: Diagnosis not present

## 2022-12-23 DIAGNOSIS — K219 Gastro-esophageal reflux disease without esophagitis: Secondary | ICD-10-CM | POA: Diagnosis not present

## 2022-12-23 DIAGNOSIS — I10 Essential (primary) hypertension: Secondary | ICD-10-CM | POA: Diagnosis not present

## 2022-12-23 DIAGNOSIS — E785 Hyperlipidemia, unspecified: Secondary | ICD-10-CM | POA: Diagnosis not present

## 2022-12-23 DIAGNOSIS — Z780 Asymptomatic menopausal state: Secondary | ICD-10-CM | POA: Diagnosis not present

## 2022-12-23 DIAGNOSIS — K5909 Other constipation: Secondary | ICD-10-CM | POA: Diagnosis not present

## 2022-12-23 DIAGNOSIS — E559 Vitamin D deficiency, unspecified: Secondary | ICD-10-CM | POA: Diagnosis not present

## 2022-12-23 DIAGNOSIS — M129 Arthropathy, unspecified: Secondary | ICD-10-CM | POA: Diagnosis not present

## 2023-11-10 ENCOUNTER — Other Ambulatory Visit: Payer: Self-pay | Admitting: Internal Medicine

## 2023-11-10 DIAGNOSIS — Z1231 Encounter for screening mammogram for malignant neoplasm of breast: Secondary | ICD-10-CM
